# Patient Record
Sex: Female | Born: 1985 | Race: White | Hispanic: No | Marital: Single | Smoking: Former smoker
Health system: Southern US, Community
[De-identification: ages and names within clinical notes are randomized; demographics above are authoritative.]

## PROBLEM LIST (undated history)

## (undated) DIAGNOSIS — O24419 Gestational diabetes mellitus in pregnancy, unspecified control: Secondary | ICD-10-CM

## (undated) DIAGNOSIS — O139 Gestational [pregnancy-induced] hypertension without significant proteinuria, unspecified trimester: Secondary | ICD-10-CM

## (undated) DIAGNOSIS — B999 Unspecified infectious disease: Secondary | ICD-10-CM

## (undated) DIAGNOSIS — E119 Type 2 diabetes mellitus without complications: Secondary | ICD-10-CM

## (undated) DIAGNOSIS — N200 Calculus of kidney: Secondary | ICD-10-CM

## (undated) HISTORY — PX: TUBAL LIGATION: SHX77

---

## 2012-05-18 HISTORY — PX: WISDOM TOOTH EXTRACTION: SHX21

## 2014-01-19 ENCOUNTER — Encounter (HOSPITAL_COMMUNITY): Payer: Self-pay | Admitting: Emergency Medicine

## 2014-01-19 ENCOUNTER — Emergency Department (HOSPITAL_COMMUNITY)
Admission: EM | Admit: 2014-01-19 | Discharge: 2014-01-19 | Disposition: A | Discharge status: Home / Self Care | Payer: Self-pay | Attending: Emergency Medicine | Admitting: Emergency Medicine

## 2014-01-19 DIAGNOSIS — M542 Cervicalgia: Secondary | ICD-10-CM | POA: Insufficient documentation

## 2014-01-19 DIAGNOSIS — Z87891 Personal history of nicotine dependence: Secondary | ICD-10-CM | POA: Insufficient documentation

## 2014-01-19 DIAGNOSIS — R059 Cough, unspecified: Secondary | ICD-10-CM | POA: Insufficient documentation

## 2014-01-19 DIAGNOSIS — M25519 Pain in unspecified shoulder: Secondary | ICD-10-CM | POA: Insufficient documentation

## 2014-01-19 DIAGNOSIS — R05 Cough: Secondary | ICD-10-CM

## 2014-01-19 MED ORDER — AZITHROMYCIN 250 MG PO TABS: 250.0000 mg | ORAL_TABLET | Freq: Every day | ORAL | Status: DC

## 2014-01-19 NOTE — ED Notes (Signed)
Pt sts was diagnosed with bronchitis a week ago, now reports generalized muscle pain, also reports cough, sts medications she was given does not help

## 2014-01-19 NOTE — ED Provider Notes (Signed)
CSN: [id number]     Arrival date & time 01/19/14  1914 History   First MD Initiated Contact with Patient 01/19/14 1008     Chief Complaint  Patient presents with  . Shoulder Pain  . Neck Pain  . Cough     (Consider location/radiation/quality/duration/timing/severity/associated sxs/prior Treatment) Patient is a 28 y.o. female presenting with cough. The history is provided by the patient. No language interpreter was used.  Cough Cough characteristics:  Non-productive Severity:  Moderate Onset quality:  Gradual Duration:  2 weeks Timing:  Constant Progression:  Worsening Chronicity:  New Smoker: no   Context: upper respiratory infection   Context: not sick contacts, not smoke exposure and not weather changes   Relieved by:  Cough suppressants and beta-agonist inhaler Worsened by:  Nothing tried Associated symptoms: rhinorrhea, sinus congestion and sore throat   Associated symptoms: no chest pain, no chills, no fever and no shortness of breath     History reviewed. No pertinent past medical history. Past Surgical History  Procedure Laterality Date  . Cesarean section    . Tubal ligation     No family history on file. History  Substance Use Topics  . Smoking status: Former Smoker    Types: Cigarettes    Quit date: 05/18/2004  . Smokeless tobacco: Not on file  . Alcohol Use: No   OB History   Grav Para Term Preterm Abortions TAB SAB Ect Mult Living                 Review of Systems  Constitutional: Negative for fever and chills.  HENT: Positive for rhinorrhea and sore throat.   Respiratory: Positive for cough. Negative for shortness of breath.   Cardiovascular: Negative for chest pain.  Gastrointestinal: Negative for nausea, vomiting, diarrhea and constipation.  Genitourinary: Negative for dysuria.      Allergies  Review of patient's allergies indicates no known allergies.  Home Medications   Prior to Admission medications   Medication Sig Start Date End  Date Taking? Authorizing Provider  azithromycin (ZITHROMAX Z-PAK) 250 MG tablet Take 1 tablet (250 mg total) by mouth daily.  PO day 1, then  PO days 205 01/19/14   Roxy Horseman, PA-C   BP 113/74  Pulse 77  Temp(Src) 98.1 F (36.7 C) (Oral)  Resp 20  SpO2 98%  LMP 01/14/2014 Physical Exam  Nursing note and vitals reviewed. Constitutional: She is oriented to person, place, and time. She appears well-developed and well-nourished.  HENT:  Head: Normocephalic and atraumatic.  No abscess, mild erythema  Eyes: Conjunctivae and EOM are normal. Pupils are equal, round, and reactive to light.  Neck: Normal range of motion. Neck supple.  Cardiovascular: Normal rate and regular rhythm.  Exam reveals no gallop and no friction rub.   No murmur heard. Pulmonary/Chest: Effort normal and breath sounds normal. No respiratory distress. She has no wheezes. She has no rales. She exhibits no tenderness.  CTAB  Abdominal: Soft. Bowel sounds are normal. She exhibits no distension and no mass. There is no tenderness. There is no rebound and no guarding.  Musculoskeletal: Normal range of motion. She exhibits no edema and no tenderness.  Neurological: She is alert and oriented to person, place, and time.  Skin: Skin is warm and dry.  Psychiatric: She has a normal mood and affect. Her behavior is normal. Judgment and thought content normal.    ED Course  Procedures (including critical care time) Labs Review Labs Reviewed - No data to display  Imaging Review No results found.   EKG Interpretation None      MDM   Final diagnoses:  Cough   Patient with URI.  Sick x2 weeks with double sickening, will start z-pak.  Recommend PCP follow-up.  Roxy Horseman, PA-C 01/19/14 1027

## 2014-01-19 NOTE — Discharge Instructions (Signed)
Cough, Adult ° A cough is a reflex that helps clear your throat and airways. It can help heal the body or may be a reaction to an irritated airway. A cough may only last 2 or 3 weeks (acute) or may last more than 8 weeks (chronic).  °CAUSES °Acute cough: °· Viral or bacterial infections. °Chronic cough: °· Infections. °· Allergies. °· Asthma. °· Post-nasal drip. °· Smoking. °· Heartburn or acid reflux. °· Some medicines. °· Chronic lung problems (COPD). °· Cancer. °SYMPTOMS  °· Cough. °· Fever. °· Chest pain. °· Increased breathing rate. °· High-pitched whistling sound when breathing (wheezing). °· Colored mucus that you cough up (sputum). °TREATMENT  °· A bacterial cough may be treated with antibiotic medicine. °· A viral cough must run its course and will not respond to antibiotics. °· Your caregiver may recommend other treatments if you have a chronic cough. °HOME CARE INSTRUCTIONS  °· Only take over-the-counter or prescription medicines for pain, discomfort, or fever as directed by your caregiver. Use cough suppressants only as directed by your caregiver. °· Use a cold steam vaporizer or humidifier in your bedroom or home to help loosen secretions. °· Sleep in a semi-upright position if your cough is worse at night. °· Rest as needed. °· Stop smoking if you smoke. °SEEK IMMEDIATE MEDICAL CARE IF:  °· You have pus in your sputum. °· Your cough starts to worsen. °· You cannot control your cough with suppressants and are losing sleep. °· You begin coughing up blood. °· You have difficulty breathing. °· You develop pain which is getting worse or is uncontrolled with medicine. °· You have a fever. °MAKE SURE YOU:  °· Understand these instructions. °· Will watch your condition. °· Will get help right away if you are not doing well or get worse. °Document Released: 10/31/2010 Document Revised: 07/27/2011 Document Reviewed: 10/31/2010 °ExitCare® Patient Information ©2015 [employer]. This information is not intended  to replace advice given to you by your health care provider. Make sure you discuss any questions you have with your health care provider. ° °Upper Respiratory Infection, Adult °An upper respiratory infection (URI) is also known as the common cold. It is often caused by a type of germ (virus). Colds are easily spread (contagious). You can pass it to others by kissing, coughing, sneezing, or drinking out of the same glass. Usually, you get better in 1 or 2 weeks.  °HOME CARE  °· Only take medicine as told by your doctor. °· Use a warm mist humidifier or breathe in steam from a hot shower. °· Drink enough water and fluids to keep your pee (urine) clear or pale yellow. °· Get plenty of rest. °· Return to work when your temperature is back to normal or as told by your doctor. You may use a face mask and wash your hands to stop your cold from spreading. °GET HELP RIGHT AWAY IF:  °· After the first few days, you feel you are getting worse. °· You have questions about your medicine. °· You have chills, shortness of breath, or brown or red spit (mucus). °· You have yellow or brown snot (nasal discharge) or pain in the face, especially when you bend forward. °· You have a fever, puffy (swollen) neck, pain when you swallow, or white spots in the back of your throat. °· You have a bad headache, ear pain, sinus pain, or chest pain. °· You have a high-pitched whistling sound when you breathe in and out (wheezing). °· You   have a lasting cough or cough up blood. °· You have sore muscles or a stiff neck. °MAKE SURE YOU:  °· Understand these instructions. °· Will watch your condition. °· Will get help right away if you are not doing well or get worse. °Document Released: 10/21/2007 Document Revised: 07/27/2011 Document Reviewed: 08/09/2013 °ExitCare® Patient Information ©2015 [employer]. This information is not intended to replace advice given to you by your health care provider. Make sure you discuss any questions you have with  your health care provider. ° °

## 2014-01-19 NOTE — ED Provider Notes (Signed)
Medical screening examination/treatment/procedure(s) were performed by non-physician practitioner and as supervising physician I was immediately available for consultation/collaboration.    Linwood Dibbles, MD 01/19/14 [phone]

## 2014-05-18 HISTORY — PX: CHOLECYSTECTOMY: SHX55

## 2016-03-05 ENCOUNTER — Emergency Department
Admission: EM | Admit: 2016-03-05 | Discharge: 2016-03-06 | Disposition: A | Discharge status: Home / Self Care | Payer: Self-pay | Attending: Emergency Medicine | Admitting: Emergency Medicine

## 2016-03-05 ENCOUNTER — Encounter: Payer: Self-pay | Admitting: *Deleted

## 2016-03-05 DIAGNOSIS — W109XXA Fall (on) (from) unspecified stairs and steps, initial encounter: Secondary | ICD-10-CM | POA: Insufficient documentation

## 2016-03-05 DIAGNOSIS — Y939 Activity, unspecified: Secondary | ICD-10-CM | POA: Insufficient documentation

## 2016-03-05 DIAGNOSIS — Y999 Unspecified external cause status: Secondary | ICD-10-CM | POA: Insufficient documentation

## 2016-03-05 DIAGNOSIS — S300XXA Contusion of lower back and pelvis, initial encounter: Secondary | ICD-10-CM | POA: Insufficient documentation

## 2016-03-05 DIAGNOSIS — Y929 Unspecified place or not applicable: Secondary | ICD-10-CM | POA: Insufficient documentation

## 2016-03-05 DIAGNOSIS — Z79899 Other long term (current) drug therapy: Secondary | ICD-10-CM | POA: Insufficient documentation

## 2016-03-05 DIAGNOSIS — Z87891 Personal history of nicotine dependence: Secondary | ICD-10-CM | POA: Insufficient documentation

## 2016-03-05 NOTE — ED Triage Notes (Signed)
Pt reports having fallen four days ago down the stairs and sliding down the stairs. Pt went to PCP after fall and was given flexeril and mobic. Pt reports she had not been feeling any relief so se returned to PCP today and was given a Toradol injection that decreased but did not relieve the pain.  Pt continues to have upper back and neck pain and reported numbness in both arms. No impairments to movement noted. Pt has not had xrays or any scans.

## 2016-03-05 NOTE — ED Notes (Signed)
Pt reports using "foam roller" to try to stretch out back, but that she feels that this increased the pain.  Pt has been seen by Dr. Purnell ShoemakerKaram on Monday and was given muscle relaxers for pain and then again this morning for unrelieved pain.  Pt states she was given a toradol shot at that time.  Pt states the toradol shot gave her some relief, but then after using the "foam roller" her pain increased.  Pt also reports feeling like her hands are asleep and that she has a headache that is unrelieved.

## 2016-03-06 ENCOUNTER — Emergency Department: Payer: Self-pay

## 2016-03-06 MED ORDER — OXYCODONE-ACETAMINOPHEN 5-325 MG PO TABS: 1.0000 | ORAL_TABLET | ORAL | 0 refills | Status: DC | PRN

## 2016-03-06 MED ORDER — OXYCODONE-ACETAMINOPHEN 5-325 MG PO TABS
1.0000 | ORAL_TABLET | Freq: Once | ORAL | Status: AC
  Administered 2016-03-06: 1 via ORAL
  Filled 2016-03-06: qty 1

## 2016-03-06 NOTE — ED Notes (Signed)
POC preg negative.

## 2016-03-06 NOTE — ED Provider Notes (Signed)
[facility] Emergency Department Provider Note   First MD Initiated Contact with Patient 03/06/16 0030     (approximate)  I have reviewed the triage vital signs and the nursing notes.   HISTORY  Chief Complaint Fall    HPI Natasha Abbott is a 30 y.o. female presents with history of "sliding down multiple steps" 4 days ago with resultant mid back and posterior neck pain. Patient denies any loss of consciousness during the fall. Patient states that she was seen by her primary care provider on Monday and prescribed a muscle relaxer and mobile take however symptoms have progressively worsened and a such she returns her primary care provider yesterday and received a Toradol injection. Patient states Toradol helped some however after going home pain worsened and she utilized a foam roller with worsening of symptoms.   Past medical history None There are no active problems to display for this patient.   Past Surgical History:  Procedure Laterality Date  . CESAREAN SECTION    . CHOLECYSTECTOMY  2016  . TUBAL LIGATION    . WISDOM TOOTH EXTRACTION  2014    Prior to Admission medications   Medication Sig Start Date End Date Taking? Authorizing Provider  azithromycin (ZITHROMAX Z-PAK) 250 MG tablet Take 1 tablet (250 mg total) by mouth daily. 500mg  PO day 1, then 250mg  PO days 205 01/19/14   Roxy Horsemanobert Browning, PA-C  oxyCODONE-acetaminophen (ROXICET) 5-325 MG tablet Take 1 tablet by mouth every 4 (four) hours as needed for severe pain. 03/06/16   Darci Currentandolph N Brown, MD    Allergies No known drug allergies  History reviewed. No pertinent family history.  Social History Social History  Substance Use Topics  . Smoking status: Former Smoker    Types: Cigarettes    Quit date: 05/18/2004  . Smokeless tobacco: Never Used  . Alcohol use No    Review of Systems Constitutional: No fever/chills Eyes: No visual changes. ENT: No sore throat. Cardiovascular: Denies  chest pain. Respiratory: Denies shortness of breath. Gastrointestinal: No abdominal pain.  No nausea, no vomiting.  No diarrhea.  No constipation. Genitourinary: Negative for dysuria. Musculoskeletal: Positive for back pain. Skin: Negative for rash. Neurological: Negative for headaches, focal weakness or numbness.  10-point ROS otherwise negative.  ____________________________________________   PHYSICAL EXAM:  VITAL SIGNS: ED Triage Vitals [03/05/16 2307]  Enc Vitals Group     BP 133/83     Pulse Rate 70     Resp 16     Temp 98.4 F (36.9 C)     Temp Source Oral     SpO2 97 %     Weight 235 lb (106.6 kg)     Height 5\' 7"  (1.702 m)     Head Circumference      Peak Flow      Pain Score 7     Pain Loc      Pain Edu?      Excl. in GC?     Constitutional: Alert and oriented. Well appearing and in no acute distress. Eyes: Conjunctivae are normal. PERRL. EOMI. Head: Atraumatic. Mouth/Throat: Mucous membranes are moist.  Oropharynx non-erythematous. Neck: No stridor.  No meningeal signs.   Cardiovascular: Normal rate, regular rhythm. Good peripheral circulation. Grossly normal heart sounds. Respiratory: Normal respiratory effort.  No retractions. Lungs CTAB. Gastrointestinal: Soft and nontender. No distention.  Musculoskeletal: No lower extremity tenderness nor edema. No gross deformities of extremities.Mid back pain with palpation Neurologic:  Normal speech and language. No  gross focal neurologic deficits are appreciated.  Skin:  Skin is warm, dry and intact. No rash noted.     RADIOLOGY I, Crook N BROWN, personally viewed and evaluated these images (plain radiographs) as part of my medical decision making, as well as reviewing the written report by the radiologist.  Dg Cervical Spine Complete  Result Date: 03/06/2016 CLINICAL DATA:  Status post fall down stairs, with neck pain. Initial encounter. EXAM: CERVICAL SPINE - COMPLETE 4+ VIEW COMPARISON:  None. FINDINGS:  There is no evidence of fracture or subluxation. There is slight reversal the normal lordotic curvature of the cervical spine, likely positional in nature. Vertebral bodies demonstrate normal height and alignment. Intervertebral disc spaces are preserved. Prevertebral soft tissues are within normal limits. The provided odontoid view demonstrates no significant abnormality. The visualized lung apices are clear. IMPRESSION: No evidence of fracture or subluxation along the cervical spine. Electronically Signed   By: Roanna Raider M.D.   On: 03/06/2016 01:40   Dg Thoracic Spine 2 View  Result Date: 03/06/2016 CLINICAL DATA:  30 year old female with fall and back pain. EXAM: THORACIC SPINE 2 VIEWS COMPARISON:  None. FINDINGS: No definite acute fracture or subluxation of the thoracic spine. Apparent linear lucency along the posterior aspect of a mid thoracic vertebral seen on the lateral projectiona is most likely artifactual and less likely represents a fracture. Correlation with point tenderness recommended. The vertebral body heights and disc spaces are maintained. The soft tissues are grossly unremarkable. Right upper quadrant cholecystectomy clips. IMPRESSION: No definite acute/ traumatic thoracic spine pathology. Apparent linear lucency along the posterior aspect of the superior endplate of a mid thoracic vertebra seen on the lateral projection likely artifactual. Correlation with point tenderness recommended. CT may provide better evaluation if there is high clinical concern for acute fracture. Electronically Signed   By: Elgie Collard M.D.   On: 03/06/2016 01:42   Ct Thoracic Spine Wo Contrast  Result Date: 03/06/2016 CLINICAL DATA:  Increased thoracic back pain EXAM: CT THORACIC SPINE WITHOUT CONTRAST TECHNIQUE: Multidetector CT imaging of the thoracic spine was performed without intravenous contrast administration. Multiplanar CT image reconstructions were also generated. COMPARISON:  Same date  thoracic spine radiographs FINDINGS: Alignment: Normal Vertebrae: Normal without acute fracture. Small Schmorl's nodes involving the inferior endplate of T11 and superior endplate of T12. Linear lucency suggested at the T9 level the radiographs is believed to be artifactual. No fracture is seen on CT. Paraspinal and other soft tissues: Negative. Disc levels: No apparent herniation. IMPRESSION: No acute osseous abnormality of the thoracic spine. Electronically Signed   By: Tollie Eth M.D.   On: 03/06/2016 02:56    ____________  Procedures    INITIAL IMPRESSION / ASSESSMENT AND PLAN / ED COURSE  Pertinent labs & imaging results that were available during my care of the patient were reviewed by me and considered in my medical decision making (see chart for details).     Clinical Course    ____________________________________________  FINAL CLINICAL IMPRESSION(S) / ED DIAGNOSES  Final diagnoses:  Contusion of lower back, initial encounter     MEDICATIONS GIVEN DURING THIS VISIT:  Medications - No data to display   NEW OUTPATIENT MEDICATIONS STARTED DURING THIS VISIT:  New Prescriptions   OXYCODONE-ACETAMINOPHEN (ROXICET) 5-325 MG TABLET    Take 1 tablet by mouth every 4 (four) hours as needed for severe pain.    Modified Medications   No medications on file    Discontinued Medications   No medications  on file     Note:  This document was prepared using Dragon voice recognition software and may include unintentional dictation errors.    Darci Current, MD 03/06/16 [phone]

## 2016-03-06 NOTE — ED Notes (Signed)
Pt discharged to home.  Family member driving.  Discharge instructions reviewed.  Verbalized understanding.  No questions or concerns at this time.  Teach back verified.  Pt in NAD.  No items left in ED.   

## 2016-10-06 ENCOUNTER — Inpatient Hospital Stay (HOSPITAL_COMMUNITY)
Admission: AD | Admit: 2016-10-06 | Discharge: 2016-10-06 | Disposition: A | Discharge status: Home / Self Care | Payer: Self-pay | Source: Ambulatory Visit | Attending: Family Medicine | Admitting: Family Medicine

## 2016-10-06 DIAGNOSIS — R102 Pelvic and perineal pain: Secondary | ICD-10-CM | POA: Insufficient documentation

## 2016-10-06 DIAGNOSIS — Z87891 Personal history of nicotine dependence: Secondary | ICD-10-CM | POA: Insufficient documentation

## 2016-10-06 DIAGNOSIS — B9689 Other specified bacterial agents as the cause of diseases classified elsewhere: Secondary | ICD-10-CM | POA: Insufficient documentation

## 2016-10-06 DIAGNOSIS — Z888 Allergy status to other drugs, medicaments and biological substances status: Secondary | ICD-10-CM | POA: Insufficient documentation

## 2016-10-06 DIAGNOSIS — N760 Acute vaginitis: Secondary | ICD-10-CM | POA: Insufficient documentation

## 2016-10-06 LAB — CBC
HCT: 39.1 % (ref 36.0–46.0)
HEMOGLOBIN: 12.4 g/dL (ref 12.0–15.0)
MCH: 26.2 pg (ref 26.0–34.0)
MCHC: 31.7 g/dL (ref 30.0–36.0)
MCV: 82.5 fL (ref 78.0–100.0)
Platelets: 375 10*3/uL (ref 150–400)
RBC: 4.74 MIL/uL (ref 3.87–5.11)
RDW: 15 % (ref 11.5–15.5)
WBC: 9 10*3/uL (ref 4.0–10.5)

## 2016-10-06 LAB — WET PREP, GENITAL
SPERM: NONE SEEN
TRICH WET PREP: NONE SEEN
YEAST WET PREP: NONE SEEN

## 2016-10-06 LAB — URINALYSIS, ROUTINE W REFLEX MICROSCOPIC
BACTERIA UA: NONE SEEN
Bilirubin Urine: NEGATIVE
Glucose, UA: NEGATIVE mg/dL
Ketones, ur: 5 mg/dL — AB
Leukocytes, UA: NEGATIVE
NITRITE: NEGATIVE
Protein, ur: NEGATIVE mg/dL
SPECIFIC GRAVITY, URINE: 1.021 (ref 1.005–1.030)
pH: 5 (ref 5.0–8.0)

## 2016-10-06 LAB — POCT PREGNANCY, URINE: PREG TEST UR: NEGATIVE

## 2016-10-06 MED ORDER — METRONIDAZOLE 500 MG PO TABS: 500.0000 mg | ORAL_TABLET | Freq: Two times a day (BID) | ORAL | 0 refills | Status: DC

## 2016-10-06 MED ORDER — KETOROLAC TROMETHAMINE 30 MG/ML IJ SOLN
30.0000 mg | Freq: Once | INTRAMUSCULAR | Status: AC
  Administered 2016-10-06: 30 mg via INTRAMUSCULAR
  Filled 2016-10-06: qty 1

## 2016-10-06 NOTE — Discharge Instructions (Signed)
Bacterial Vaginosis Bacterial vaginosis is a vaginal infection that occurs when the normal balance of bacteria in the vagina is disrupted. It results from an overgrowth of certain bacteria. This is the most common vaginal infection among women ages 15-44. Because bacterial vaginosis increases your risk for STIs (sexually transmitted infections), getting treated can help reduce your risk for chlamydia, gonorrhea, herpes, and HIV (human immunodeficiency virus). Treatment is also important for preventing complications in pregnant women, because this condition can cause an early (premature) delivery. What are the causes? This condition is caused by an increase in harmful bacteria that are normally present in small amounts in the vagina. However, the reason that the condition develops is not fully understood. What increases the risk? The following factors may make you more likely to develop this condition:  Having a new sexual partner or multiple sexual partners.  Having unprotected sex.  Douching.  Having an intrauterine device (IUD).  Smoking.  Drug and alcohol abuse.  Taking certain antibiotic medicines.  Being pregnant.  You cannot get bacterial vaginosis from toilet seats, bedding, swimming pools, or contact with objects around you. What are the signs or symptoms? Symptoms of this condition include:  Grey or white vaginal discharge. The discharge can also be watery or foamy.  A fish-like odor with discharge, especially after sexual intercourse or during menstruation.  Itching in and around the vagina.  Burning or pain with urination.  Some women with bacterial vaginosis have no signs or symptoms. How is this diagnosed? This condition is diagnosed based on:  Your medical history.  A physical exam of the vagina.  Testing a sample of vaginal fluid under a microscope to look for a large amount of bad bacteria or abnormal cells. Your health care provider may use a cotton swab  or a small wooden spatula to collect the sample.  How is this treated? This condition is treated with antibiotics. These may be given as a pill, a vaginal cream, or a medicine that is put into the vagina (suppository). If the condition comes back after treatment, a second round of antibiotics may be needed. Follow these instructions at home: Medicines  Take over-the-counter and prescription medicines only as told by your health care provider.  Take or use your antibiotic as told by your health care provider. Do not stop taking or using the antibiotic even if you start to feel better. General instructions  If you have a female sexual partner, tell her that you have a vaginal infection. She should see her health care provider and be treated if she has symptoms. If you have a female sexual partner, he does not need treatment.  During treatment: ? Avoid sexual activity until you finish treatment. ? Do not douche. ? Avoid alcohol as directed by your health care provider. ? Avoid breastfeeding as directed by your health care provider.  Drink enough water and fluids to keep your urine clear or pale yellow.  Keep the area around your vagina and rectum clean. ? Wash the area daily with warm water. ? Wipe yourself from front to back after using the toilet.  Keep all follow-up visits as told by your health care provider. This is important. How is this prevented?  Do not douche.  Wash the outside of your vagina with warm water only.  Use protection when having sex. This includes latex condoms and dental dams.  Limit how many sexual partners you have. To help prevent bacterial vaginosis, it is best to have sex with just   one partner (monogamous).  Make sure you and your sexual partner are tested for STIs.  Wear cotton or cotton-lined underwear.  Avoid wearing tight pants and pantyhose, especially during summer.  Limit the amount of alcohol that you drink.  Do not use any products that  contain nicotine or tobacco, such as cigarettes and e-cigarettes. If you need help quitting, ask your health care provider.  Do not use illegal drugs. Where to find more information:  Centers for Disease Control and Prevention: www.cdc.gov/std  American Sexual Health Association (ASHA): www.ashastd.org  [place] Department of Health and Human Services, Office on Women's Health: www.womenshealth.gov/ or https://www.womenshealth.gov/a-z-topics/bacterial-vaginosis Contact a health care provider if:  Your symptoms do not improve, even after treatment.  You have more discharge or pain when urinating.  You have a fever.  You have pain in your abdomen.  You have pain during sex.  You have vaginal bleeding between periods. Summary  Bacterial vaginosis is a vaginal infection that occurs when the normal balance of bacteria in the vagina is disrupted.  Because bacterial vaginosis increases your risk for STIs (sexually transmitted infections), getting treated can help reduce your risk for chlamydia, gonorrhea, herpes, and HIV (human immunodeficiency virus). Treatment is also important for preventing complications in pregnant women, because the condition can cause an early (premature) delivery.  This condition is treated with antibiotic medicines. These may be given as a pill, a vaginal cream, or a medicine that is put into the vagina (suppository). This information is not intended to replace advice given to you by your health care provider. Make sure you discuss any questions you have with your health care provider. Document Released: 05/04/2005 Document Revised: 01/18/2016 Document Reviewed: 01/18/2016 [employer] Interactive Patient Education  2017 [employer]  

## 2016-10-06 NOTE — MAU Provider Note (Signed)
History     CSN: [id number]  Arrival date and time: 10/06/16 1844   First Provider Initiated Contact with Patient 10/06/16 1931     Non-pregnant female here with LAP. Pain started about 2 mos ago but has worsened over the last 2 weeks. She describes as bilateral, constant, achy, and at the level of her CS scar. Rates 5/10. Has tried heat, Ibuprofen, Tylenol, and Aleve and had no relief. Denies fever. No urinary sx. No vaginal discharge but reports intermenstrual spotting, which occasionally occurs after IC. No new partner in 5 years. No hx of STIs. LMP 09/27/16.    No past medical history on file.  Past Surgical History:  Procedure Laterality Date  . CESAREAN SECTION    . CHOLECYSTECTOMY  2016  . TUBAL LIGATION    . WISDOM TOOTH EXTRACTION  2014    No family history on file.  Social History  Substance Use Topics  . Smoking status: Former Smoker    Types: Cigarettes    Quit date: 05/18/2004  . Smokeless tobacco: Never Used  . Alcohol use No    Allergies:  Allergies  Allergen Reactions  . 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 ALPRAZolam (XANAX) 1 MG tablet Take 0.5-1 mg by mouth daily as needed for anxiety (panic attack).   Past Week at Unknown time  . naproxen sodium (ANAPROX) 220 MG tablet Take 220 mg by mouth 2 (two) times daily as needed (pain).   10/05/2016 at Unknown time  . Naproxen Sodium (PAMPRIN ALL DAY RELIEF MAX ST PO) Take 2 tablets by mouth daily as needed (cramps).   10/06/2016 at Unknown time  . Pediatric Multiple Vit-C-FA (FLINSTONES GUMMIES OMEGA-3 DHA) CHEW Chew 1 tablet by mouth daily.   Past Month at Unknown time  . oxyCODONE-acetaminophen (ROXICET) 5-325 MG tablet Take 1 tablet by mouth every 4 (four) hours as needed for severe pain. (Patient not taking: Reported on 10/06/2016)  12 tablet 0 Not Taking at Unknown time    Review of Systems  Constitutional: Positive for activity change.  Gastrointestinal: Positive for abdominal pain and nausea. Negative for constipation, diarrhea and vomiting.  Genitourinary: Positive for dyspareunia and vaginal bleeding. Negative for dysuria, frequency, urgency and vaginal discharge.   Physical Exam   Blood pressure 121/68, pulse 74, temperature 97.7 F (36.5 C), temperature source Oral, resp. rate 18, height 5\' 7"  (1.702 m), weight 113.4 kg (250 lb).  Physical Exam  Constitutional: She is oriented to person, place, and time. She appears well-developed and well-nourished. No distress (appears comfortable).  HENT:  Head: Normocephalic and atraumatic.  Neck: Normal range of motion.  Cardiovascular: Normal rate.   Respiratory: Effort normal.  GI: Soft. She exhibits no distension and no mass. There is no tenderness. There is no rebound and no guarding.  Genitourinary:  Genitourinary Comments: External: no lesions or erythema Vagina: rugated, parous, thin watery brown discharge with malodor Uterus: non enlarged, anteverted, non tender, no CMT Adnexae: no masses, + tenderness left, + tenderness right   Musculoskeletal: Normal range of motion.  Neurological: She is alert and oriented to person, place, and time.  Skin: Skin is warm and dry.  Psychiatric: She has a normal mood and affect.   Results for orders placed or performed during the hospital encounter of 10/06/16 (from the past 24 hour(s))  Urinalysis, Routine w  reflex microscopic     Status: Abnormal   Collection Time: 10/06/16  7:07 PM  Result Value Ref Range   Color, Urine YELLOW YELLOW   APPearance CLEAR CLEAR   Specific Gravity, Urine 1.021 1.005 - 1.030   pH 5.0 5.0 - 8.0   Glucose, UA NEGATIVE NEGATIVE mg/dL   Hgb urine dipstick MODERATE (A) NEGATIVE   Bilirubin Urine NEGATIVE NEGATIVE   Ketones, ur 5 (A) NEGATIVE mg/dL   Protein, ur NEGATIVE NEGATIVE mg/dL    Nitrite NEGATIVE NEGATIVE   Leukocytes, UA NEGATIVE NEGATIVE   RBC / HPF 0-5 0 - 5 RBC/hpf   WBC, UA 0-5 0 - 5 WBC/hpf   Bacteria, UA NONE SEEN NONE SEEN   Squamous Epithelial / LPF 0-5 (A) NONE SEEN   Mucous PRESENT   Pregnancy, urine POC     Status: None   Collection Time: 10/06/16  7:19 PM  Result Value Ref Range   Preg Test, Ur NEGATIVE NEGATIVE  Wet prep, genital     Status: Abnormal   Collection Time: 10/06/16  7:45 PM  Result Value Ref Range   Yeast Wet Prep HPF POC NONE SEEN NONE SEEN   Trich, Wet Prep NONE SEEN NONE SEEN   Clue Cells Wet Prep HPF POC PRESENT (A) NONE SEEN   WBC, Wet Prep HPF POC FEW (A) NONE SEEN   Sperm NONE SEEN   CBC     Status: None   Collection Time: 10/06/16  8:04 PM  Result Value Ref Range   WBC 9.0 4.0 - 10.5 K/uL   RBC 4.74 3.87 - 5.11 MIL/uL   Hemoglobin 12.4 12.0 - 15.0 g/dL   HCT 16.1 09.6 - 04.5 %   MCV 82.5 78.0 - 100.0 fL   MCH 26.2 26.0 - 34.0 pg   MCHC 31.7 30.0 - 36.0 g/dL   RDW 40.9 81.1 - 91.4 %   Platelets 375 150 - 400 K/uL   MAU Course  Procedures Toradol 30 mg IM  MDM Labs ordered and reviewed. Some improvement of pain initially after medication. No evidence of acute abdominal or pelvic process. Pain could be caused by BV. If pain persists after course of Flagyl then I recommend f/u with GYN as she may need further workup for her pelvic pain. She is stable for discharge home.   Assessment and Plan   1. Pelvic pain in female   2. 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   FLINSTONES GUMMIES OMEGA-3 DHA Chew Chew 1 tablet by mouth daily.   metroNIDAZOLE 500 MG tablet Commonly known as:  FLAGYL Take 1 tablet (500 mg total) by mouth 2 (two) times daily.   naproxen sodium 220 MG tablet Commonly known as:  ANAPROX Take 220 mg by mouth 2 (two) times daily as needed (pain). What changed:  Another medication with the same name was  removed. Continue taking this medication, and follow the directions you see here.      Donette LarryMelanie Brittanie Dosanjh, CNM 10/06/2016, 7:52 PM

## 2016-10-06 NOTE — MAU Note (Signed)
Pt. Here with complaint of pain in lower abd, aching. Started about two weeks ago, pain getting stronger.  Also a complaint of vaginal spotting.

## 2016-10-07 LAB — GC/CHLAMYDIA PROBE AMP (~~LOC~~) NOT AT ARMC
CHLAMYDIA, DNA PROBE: NEGATIVE
NEISSERIA GONORRHEA: NEGATIVE

## 2016-11-06 ENCOUNTER — Encounter: Payer: Self-pay | Admitting: Obstetrics & Gynecology

## 2016-12-01 ENCOUNTER — Encounter: Payer: Self-pay | Admitting: Obstetrics & Gynecology

## 2017-07-17 DIAGNOSIS — E119 Type 2 diabetes mellitus without complications: Secondary | ICD-10-CM

## 2017-07-17 HISTORY — DX: Type 2 diabetes mellitus without complications: E11.9

## 2017-07-22 ENCOUNTER — Inpatient Hospital Stay (HOSPITAL_COMMUNITY)
Admission: AD | Admit: 2017-07-22 | Discharge: 2017-07-22 | Disposition: A | Discharge status: Home / Self Care | Payer: Self-pay | Source: Ambulatory Visit | Attending: Obstetrics and Gynecology | Admitting: Obstetrics and Gynecology

## 2017-07-22 ENCOUNTER — Other Ambulatory Visit: Payer: Self-pay

## 2017-07-22 ENCOUNTER — Encounter (HOSPITAL_COMMUNITY): Payer: Self-pay | Admitting: *Deleted

## 2017-07-22 DIAGNOSIS — N939 Abnormal uterine and vaginal bleeding, unspecified: Secondary | ICD-10-CM | POA: Insufficient documentation

## 2017-07-22 DIAGNOSIS — Z7984 Long term (current) use of oral hypoglycemic drugs: Secondary | ICD-10-CM | POA: Insufficient documentation

## 2017-07-22 DIAGNOSIS — Z87891 Personal history of nicotine dependence: Secondary | ICD-10-CM | POA: Insufficient documentation

## 2017-07-22 DIAGNOSIS — Z79899 Other long term (current) drug therapy: Secondary | ICD-10-CM | POA: Insufficient documentation

## 2017-07-22 DIAGNOSIS — M545 Low back pain: Secondary | ICD-10-CM | POA: Insufficient documentation

## 2017-07-22 DIAGNOSIS — E119 Type 2 diabetes mellitus without complications: Secondary | ICD-10-CM | POA: Insufficient documentation

## 2017-07-22 DIAGNOSIS — M549 Dorsalgia, unspecified: Secondary | ICD-10-CM

## 2017-07-22 DIAGNOSIS — Z87442 Personal history of urinary calculi: Secondary | ICD-10-CM | POA: Insufficient documentation

## 2017-07-22 DIAGNOSIS — R109 Unspecified abdominal pain: Secondary | ICD-10-CM | POA: Insufficient documentation

## 2017-07-22 HISTORY — DX: Calculus of kidney: N20.0

## 2017-07-22 HISTORY — DX: Unspecified infectious disease: B99.9

## 2017-07-22 HISTORY — DX: Gestational diabetes mellitus in pregnancy, unspecified control: O24.419

## 2017-07-22 HISTORY — DX: Gestational (pregnancy-induced) hypertension without significant proteinuria, unspecified trimester: O13.9

## 2017-07-22 HISTORY — DX: Type 2 diabetes mellitus without complications: E11.9

## 2017-07-22 LAB — URINALYSIS, ROUTINE W REFLEX MICROSCOPIC
BACTERIA UA: NONE SEEN
Bilirubin Urine: NEGATIVE
Glucose, UA: NEGATIVE mg/dL
Ketones, ur: NEGATIVE mg/dL
Leukocytes, UA: NEGATIVE
Nitrite: NEGATIVE
PH: 6 (ref 5.0–8.0)
PROTEIN: NEGATIVE mg/dL
Specific Gravity, Urine: 1.017 (ref 1.005–1.030)

## 2017-07-22 LAB — CBC
HCT: 37 % (ref 36.0–46.0)
Hemoglobin: 12.1 g/dL (ref 12.0–15.0)
MCH: 26.7 pg (ref 26.0–34.0)
MCHC: 32.7 g/dL (ref 30.0–36.0)
MCV: 81.7 fL (ref 78.0–100.0)
PLATELETS: 404 10*3/uL — AB (ref 150–400)
RBC: 4.53 MIL/uL (ref 3.87–5.11)
RDW: 14.1 % (ref 11.5–15.5)
WBC: 9 10*3/uL (ref 4.0–10.5)

## 2017-07-22 LAB — WET PREP, GENITAL
Clue Cells Wet Prep HPF POC: NONE SEEN
Sperm: NONE SEEN
TRICH WET PREP: NONE SEEN
Yeast Wet Prep HPF POC: NONE SEEN

## 2017-07-22 LAB — POCT PREGNANCY, URINE: Preg Test, Ur: NEGATIVE

## 2017-07-22 MED ORDER — CYCLOBENZAPRINE HCL 10 MG PO TABS: 10.0000 mg | ORAL_TABLET | Freq: Three times a day (TID) | ORAL | 0 refills | Status: DC | PRN

## 2017-07-22 MED ORDER — KETOROLAC TROMETHAMINE 30 MG/ML IJ SOLN
60.0000 mg | Freq: Once | INTRAMUSCULAR | Status: AC
  Administered 2017-07-22: 60 mg via INTRAMUSCULAR
  Filled 2017-07-22: qty 2

## 2017-07-22 NOTE — MAU Provider Note (Signed)
History     CSN: [id number]  Arrival date and time: 07/22/17 1545   First Provider Initiated Contact with Patient 07/22/17 1619      Chief Complaint  Patient presents with  . Vaginal Bleeding   32 y.o. nonpregnant female here with abnormal VB and LBP. Back pain is low and bilateral, started earlier this week. She originally thought it was from cleaning out a storage closet the weekend before. She has used Tylenol and Ibuprofen, and heat and only warm baths help. She is also concerned because her LMP was last week then she started having cramping and VB today. She has used 1 pad today. No fevers. No urinary sx. No GI sx. She reports heavier, more painful menses over the last 2 years. [place] in [place] but lost her insurance and did not have follow up.   Past Medical History:  Diagnosis Date  . Gestational diabetes    on insulin with 3rd  . Infection    UTI  . Kidney stones   . Pregnancy induced hypertension    mild elevation with first   . Type 2 diabetes mellitus (HCC) 07/17/2017   started on Metformin    Past Surgical History:  Procedure Laterality Date  . CESAREAN SECTION    . CHOLECYSTECTOMY  2016  . TUBAL LIGATION    . WISDOM TOOTH EXTRACTION  2014    Family History  Problem Relation Age of Onset  . Hypothyroidism Mother   . Hypertension Father   . Diabetes Father     Social History   Tobacco Use  . Smoking status: Former Smoker    Types: Cigarettes    Last attempt to quit: 05/18/2004    Years since quitting: 13.1  . Smokeless tobacco: Never Used  Substance Use Topics  . Alcohol use: No  . Drug use: No    Allergies:  Allergies  Allergen Reactions  . 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 ALPRAZolam (XANAX) 1 MG tablet Take 0.5-1 mg by mouth daily as needed  for anxiety (panic attack).   07/21/2017 at Unknown time  . metFORMIN (GLUCOPHAGE) 500 MG tablet Take 500 mg by mouth 2 (two) times daily with a meal.   07/21/2017 at Unknown time    Review of Systems  Constitutional: Negative for fever.  Gastrointestinal: Positive for abdominal pain. Negative for constipation, diarrhea, nausea and vomiting.  Genitourinary: Positive for vaginal bleeding. Negative for dysuria, frequency and urgency.  Musculoskeletal: Positive for back pain.   Physical Exam   Blood pressure 135/73, pulse 75, temperature 98.4 F (36.9 C), temperature source Oral, resp. rate 18, last menstrual period 07/13/2017.  Physical Exam  Nursing note and vitals reviewed. Constitutional: She is oriented to person, place, and time. She appears well-developed and well-nourished. No distress.  HENT:  Head: Normocephalic and atraumatic.  Neck: Normal range of motion.  Cardiovascular: Normal rate.  Respiratory: Effort normal. No respiratory distress.  GI: Soft. She exhibits no distension and no mass. There is tenderness in the right lower quadrant, suprapubic area and left lower quadrant. There is no rebound, no guarding and no CVA tenderness.  Genitourinary:  Genitourinary Comments: External: no lesions or erythema Vagina: rugated, pink, moist, scant drk red bloody discharge, no active bleeding Uterus: non enlarged, anteverted, non tender, no CMT Adnexae: no masses, no tenderness  left, no tenderness right    Musculoskeletal: Normal range of motion.       Cervical back: Normal. She exhibits no tenderness.       Thoracic back: Normal. She exhibits no tenderness.       Lumbar back: She exhibits tenderness. She exhibits no deformity.  Neurological: She is alert and oriented to person, place, and time.  Skin: Skin is warm and dry.  Psychiatric: She has a normal mood and affect.   Results for orders placed or performed during the hospital encounter of 07/22/17 (from the past 24 hour(s))   Urinalysis, Routine w reflex microscopic     Status: Abnormal   Collection Time: 07/22/17  3:59 PM  Result Value Ref Range   Color, Urine YELLOW YELLOW   APPearance CLEAR CLEAR   Specific Gravity, Urine 1.017 1.005 - 1.030   pH 6.0 5.0 - 8.0   Glucose, UA NEGATIVE NEGATIVE mg/dL   Hgb urine dipstick LARGE (A) NEGATIVE   Bilirubin Urine NEGATIVE NEGATIVE   Ketones, ur NEGATIVE NEGATIVE mg/dL   Protein, ur NEGATIVE NEGATIVE mg/dL   Nitrite NEGATIVE NEGATIVE   Leukocytes, UA NEGATIVE NEGATIVE   RBC / HPF 0-5 0 - 5 RBC/hpf   WBC, UA 0-5 0 - 5 WBC/hpf   Bacteria, UA NONE SEEN NONE SEEN   Squamous Epithelial / LPF 0-5 (A) NONE SEEN  Pregnancy, urine POC     Status: None   Collection Time: 07/22/17  4:22 PM  Result Value Ref Range   Preg Test, Ur NEGATIVE NEGATIVE  Wet prep, genital     Status: Abnormal   Collection Time: 07/22/17  4:41 PM  Result Value Ref Range   Yeast Wet Prep HPF POC NONE SEEN NONE SEEN   Trich, Wet Prep NONE SEEN NONE SEEN   Clue Cells Wet Prep HPF POC NONE SEEN NONE SEEN   WBC, Wet Prep HPF POC FEW (A) NONE SEEN   Sperm NONE SEEN   CBC     Status: Abnormal   Collection Time: 07/22/17  5:05 PM  Result Value Ref Range   WBC 9.0 4.0 - 10.5 K/uL   RBC 4.53 3.87 - 5.11 MIL/uL   Hemoglobin 12.1 12.0 - 15.0 g/dL   HCT 16.1 09.6 - 04.5 %   MCV 81.7 78.0 - 100.0 fL   MCH 26.7 26.0 - 34.0 pg   MCHC 32.7 30.0 - 36.0 g/dL   RDW 40.9 81.1 - 91.4 %   Platelets 404 (H) 150 - 400 K/uL   MAU Course  Procedures Toradol  MDM Labs ordered and reviewed. Cramping improved after med, back pain continues. No evidence of acute abdominal or pelvic process. LBP likely MSK. Unclear etiology of VB today. Recommend oupatient f/u with GYN (already scheduled for next month). Stable for discharge home.   Assessment and Plan   1. Musculoskeletal back pain   2. Abnormal uterine bleeding (AUB)   3. 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   cyclobenzaprine 10 MG tablet Commonly known as:  FLEXERIL Take 1 tablet (10 mg total) by mouth every 8 (eight) hours as needed for muscle spasms.   metFORMIN 500 MG tablet Commonly known as:  GLUCOPHAGE Take 500 mg by mouth 2 (two) times daily with a meal.      Donette Larry, CNM 07/22/2017, 6:33 PM

## 2017-07-22 NOTE — MAU Note (Signed)
Pt presents with c/o heavy VB and passing large clots.  States bleeding began today, reports LMP 07/13/17.  Reports s/p BTL.

## 2017-07-22 NOTE — MAU Note (Signed)
Just got off period last wk (ontime, but super heavy), been out of work this week because of back pain, started cramping and bleeding earlier today.

## 2017-07-22 NOTE — Discharge Instructions (Signed)
Abnormal Uterine Bleeding Abnormal uterine bleeding means bleeding more than usual from your uterus. It can include:  Bleeding between periods.  Bleeding after sex.  Bleeding that is heavier than normal.  Periods that last longer than usual.  Bleeding after you have stopped having your period (menopause).  There are many problems that may cause this. You should see a doctor for any kind of bleeding that is not normal. Treatment depends on the cause of the bleeding. Follow these instructions at home:  Watch your condition for any changes.  Do not use tampons, douche, or have sex, if your doctor tells you not to.  Change your pads often.  Get regular well-woman exams. Make sure they include a pelvic exam and cervical cancer screening.  Keep all follow-up visits as told by your doctor. This is important. Contact a doctor if:  The bleeding lasts more than one week.  You feel dizzy at times.  You feel like you are going to throw up (nauseous).  You throw up. Get help right away if:  You pass out.  You have to change pads every hour.  You have belly (abdominal) pain.  You have a fever.  You get sweaty.  You get weak.  You passing large blood clots from your vagina. Summary  Abnormal uterine bleeding means bleeding more than usual from your uterus.  There are many problems that may cause this. You should see a doctor for any kind of bleeding that is not normal.  Treatment depends on the cause of the bleeding. This information is not intended to replace advice given to you by your health care provider. Make sure you discuss any questions you have with your health care provider. Document Released: 03/01/2009 Document Revised: 04/28/2016 Document Reviewed: 04/28/2016 Elsevier Interactive Patient Education  2017 [employer] Back Pain, Adult Back pain is very common. The pain often gets better over time. The cause of back pain is usually not dangerous. Most  people can learn to manage their back pain on their own. Follow these instructions at home: Watch your back pain for any changes. The following actions may help to lessen any pain you are feeling:  Stay active. Start with short walks on flat ground if you can. Try to walk farther each day.  Exercise regularly as told by your doctor. Exercise helps your back heal faster. It also helps avoid future injury by keeping your muscles strong and flexible.  Do not sit, drive, or stand in one place for more than 30 minutes.  Do not stay in bed. Resting more than 1-2 days can slow down your recovery.  Be careful when you bend or lift an object. Use good form when lifting: ? Bend at your knees. ? Keep the object close to your body. ? Do not twist.  Sleep on a firm mattress. Lie on your side, and bend your knees. If you lie on your back, put a pillow under your knees.  Take medicines only as told by your doctor.  Put ice on the injured area. ? Put ice in a plastic bag. ? Place a towel between your skin and the bag. ? Leave the ice on for 20 minutes, 2-3 times a day for the first 2-3 days. After that, you can switch between ice and heat packs.  Avoid feeling anxious or stressed. Find good ways to deal with stress, such as exercise.  Maintain a healthy weight. Extra weight puts stress on your back.  Contact a doctor  if:  You have pain that does not go away with rest or medicine.  You have worsening pain that goes down into your legs or buttocks.  You have pain that does not get better in one week.  You have pain at night.  You lose weight.  You have a fever or chills. Get help right away if:  You cannot control when you poop (bowel movement) or pee (urinate).  Your arms or legs feel weak.  Your arms or legs lose feeling (numbness).  You feel sick to your stomach (nauseous) or throw up (vomit).  You have belly (abdominal) pain.  You feel like you may pass out (faint). This  information is not intended to replace advice given to you by your health care provider. Make sure you discuss any questions you have with your health care provider. Document Released: 10/21/2007 Document Revised: 10/10/2015 Document Reviewed: 09/05/2013 Elsevier Interactive Patient Education  [employer].

## 2017-07-24 LAB — GC/CHLAMYDIA PROBE AMP (~~LOC~~) NOT AT ARMC
Chlamydia: NEGATIVE
Neisseria Gonorrhea: NEGATIVE

## 2017-08-23 ENCOUNTER — Ambulatory Visit: Payer: Self-pay | Admitting: Family Medicine

## 2017-08-23 ENCOUNTER — Encounter: Payer: Self-pay | Admitting: Family Medicine

## 2017-08-23 VITALS — BP 117/72 | HR 83 | Wt 260.5 lb

## 2017-08-23 DIAGNOSIS — N946 Dysmenorrhea, unspecified: Secondary | ICD-10-CM

## 2017-08-23 DIAGNOSIS — N920 Excessive and frequent menstruation with regular cycle: Secondary | ICD-10-CM | POA: Insufficient documentation

## 2017-08-23 DIAGNOSIS — R102 Pelvic and perineal pain: Secondary | ICD-10-CM

## 2017-08-23 DIAGNOSIS — E119 Type 2 diabetes mellitus without complications: Secondary | ICD-10-CM | POA: Insufficient documentation

## 2017-08-23 DIAGNOSIS — K219 Gastro-esophageal reflux disease without esophagitis: Secondary | ICD-10-CM

## 2017-08-23 MED ORDER — TRAMADOL HCL 50 MG PO TABS: 50.0000 mg | ORAL_TABLET | Freq: Four times a day (QID) | ORAL | 0 refills | Status: DC | PRN

## 2017-08-23 NOTE — Progress Notes (Signed)
Free Pap Screening info given  US scheduled for April 12th @ 1100.  Pt notified.

## 2017-08-23 NOTE — Assessment & Plan Note (Signed)
Check u/s--states she had normal blood work with PCP

## 2017-08-23 NOTE — Progress Notes (Signed)
   Subjective:    Patient ID: Natasha Abbott is a 32 y.o. female presenting with Menorrhagia  on 08/23/2017  HPI: Seen previously with Dr. Donavan FoilBass, and options reviewed for heavy bleeding, no f/u due of insurance. Had C-section with BTL 7 years ago. Noted that her cycles were heavier. Last one year, more pain and bleeding and passing clots. Pain is worse on her left side. Pain awakens her from sleep. Tries Ibuprofen and tylenol and heat and hot baths and nothing is really helping. Also tried [employer] ointment. Notes significant abdominal pain when taking NSAIDS. She and her partner are considering BTL reversal if she loses weight.  Review of Systems  Constitutional: Negative for chills and fever.  Respiratory: Negative for shortness of breath.   Cardiovascular: Negative for chest pain.  Gastrointestinal: Negative for abdominal pain, nausea and vomiting.  Genitourinary: Negative for dysuria.  Skin: Negative for rash.      Objective:    BP 117/72   Pulse 83   Wt 260 lb 8 oz (118.2 kg)   LMP 08/16/2017 (Approximate)   BMI 40.80 kg/m  Physical Exam  Constitutional: She is oriented to person, place, and time. She appears well-developed and well-nourished. No distress.  HENT:  Head: Normocephalic and atraumatic.  Eyes: No scleral icterus.  Neck: Neck supple.  Cardiovascular: Normal rate.  Pulmonary/Chest: Effort normal.  Abdominal: Soft.  Neurological: She is alert and oriented to person, place, and time.  Skin: Skin is warm and dry.  Psychiatric: She has a normal mood and affect.      Assessment & Plan:   Problem List Items Addressed This Visit      Unprioritized   Menorrhagia    Check u/s--states she had normal blood work with PCP      Relevant Orders   US PELVIS (TRANSABDOMINAL ONLY)   US PELVIS TRANSVANGINAL NON-OB (TV ONLY)   Diabetes type 2, controlled (HCC)   Morbid obesity (HCC)   GERD (gastroesophageal reflux disease)   Dysmenorrhea    Trial of Tramadol. May use  OTC PPI so that she tolerates NSAIDS. Check pelvic sono, may have adenomyosis due to C-section or other ovarian pathology.--may need laparoscopy if nothing found to r/o endometriosis or treat other finding.       Other Visit Diagnoses    Pelvic pain    -  Primary   Relevant Medications   traMADol (ULTRAM) 50 MG tablet      Total face-to-face time with patient: 20 minutes. Over 50% of encounter was spent on counseling and coordination of care. Return in about 1 month (around 09/20/2017).  Reva Boresanya S Carrina Schoenberger 08/23/2017 9:59 AM

## 2017-08-23 NOTE — Patient Instructions (Signed)
Menorrhagia Menorrhagia is when your menstrual periods are heavy or last longer than usual. Follow these instructions at home:  Only take medicine as told by your doctor.  Take any iron pills as told by your doctor. Heavy bleeding may cause low levels of iron in your body.  Do not take aspirin 1 week before or during your period. Aspirin can make the bleeding worse.  Lie down for a while if you change your tampon or pad more than once in 2 hours. This may help lessen the bleeding.  Eat a healthy diet and foods with iron. These foods include leafy green vegetables, meat, liver, eggs, and whole grain breads and cereals.  Do not try to lose weight. Wait until the heavy bleeding has stopped and your iron level is normal. Contact a doctor if:  You soak through a pad or tampon every 1 or 2 hours, and this happens every time you have a period.  You need to use pads and tampons at the same time because you are bleeding so much.  You need to change your pad or tampon during the night.  You have a period that lasts for more than 8 days.  You pass clots bigger than 1 inch (2.5 cm) wide.  You have irregular periods that happen more or less often than once a month.  You feel dizzy or pass out (faint).  You feel very weak or tired.  You feel short of breath or feel your heart is beating too fast when you exercise.  You feel sick to your stomach (nausea) and you throw up (vomit) while you are taking your medicine.  You have watery poop (diarrhea) while you are taking your medicine.  You have any problems that may be related to the medicine you are taking. Get help right away if:  You soak through 4 or more pads or tampons in 2 hours.  You have any bleeding while you are pregnant. This information is not intended to replace advice given to you by your health care provider. Make sure you discuss any questions you have with your health care provider. Document Released: 02/11/2008 Document  Revised: 10/10/2015 Document Reviewed: 11/03/2012 Elsevier Interactive Patient Education  2017 Elsevier Inc.  

## 2017-08-23 NOTE — Assessment & Plan Note (Signed)
Trial of Tramadol. May use OTC PPI so that she tolerates NSAIDS. Check pelvic sono, may have adenomyosis due to C-section or other ovarian pathology.--may need laparoscopy if nothing found to r/o endometriosis or treat other finding.

## 2017-08-27 ENCOUNTER — Ambulatory Visit (HOSPITAL_COMMUNITY)
Admission: RE | Admit: 2017-08-27 | Discharge: 2017-08-27 | Disposition: A | Discharge status: Home / Self Care | Payer: Self-pay | Source: Ambulatory Visit | Attending: Family Medicine | Admitting: Family Medicine

## 2017-08-27 DIAGNOSIS — N920 Excessive and frequent menstruation with regular cycle: Secondary | ICD-10-CM | POA: Insufficient documentation

## 2017-09-24 ENCOUNTER — Ambulatory Visit: Payer: Medicaid Other | Admitting: Family Medicine

## 2017-09-28 ENCOUNTER — Encounter: Payer: Self-pay | Admitting: Family Medicine

## 2017-09-28 ENCOUNTER — Ambulatory Visit: Payer: Medicaid Other | Admitting: Family Medicine

## 2017-09-28 NOTE — Progress Notes (Signed)
Patient did not keep appointment today. She may call to reschedule.  

## 2017-12-29 IMAGING — CT CT T SPINE W/O CM
2 of 3 series · 11 of 33 positions shown, 13 images · non-contrast
Comparison: Same date thoracic spine radiographs

CLINICAL DATA: Increased thoracic back pain

EXAM:
CT THORACIC SPINE WITHOUT CONTRAST
TECHNIQUE: Multidetector CT imaging of the thoracic spine was performed without
intravenous contrast administration. Multiplanar CT image
reconstructions were also generated.

[Series 4: t spine soft · axial · 0.28mm/px · z∈[-746,-482]mm · 8 of 158 slices shown, 10 images]
[im 13/158  soft-tissue]
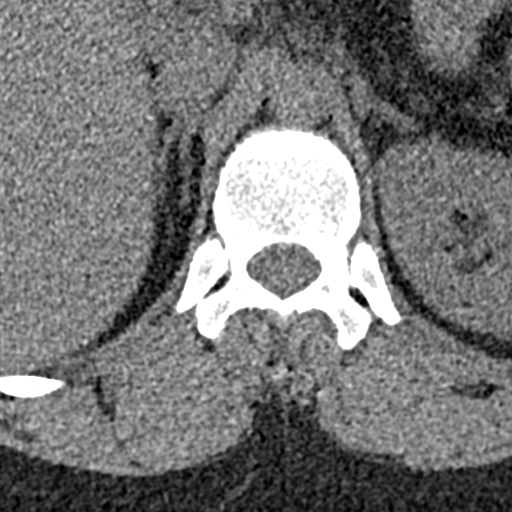
[im 13/158  bone]
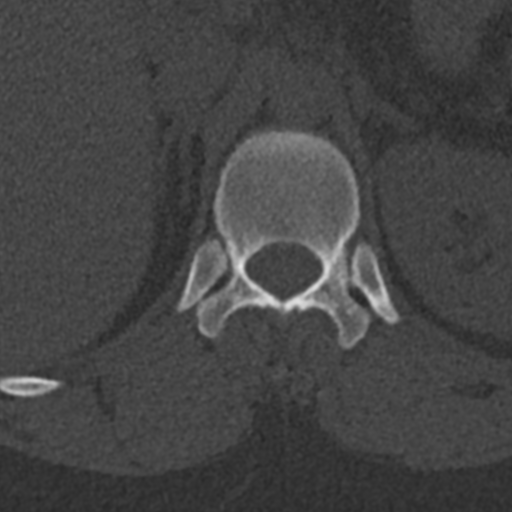
[im 37/158  bone]
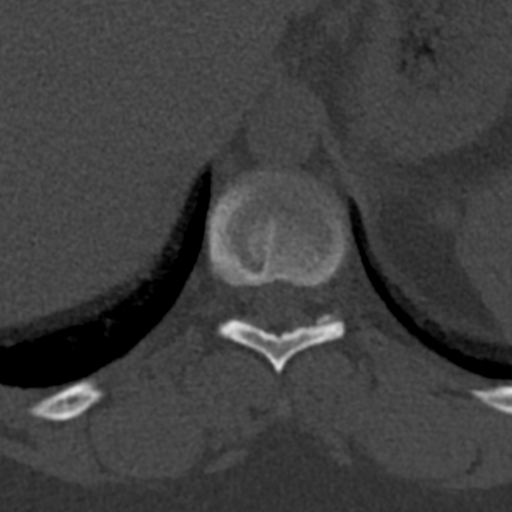
[im 49/158  bone]
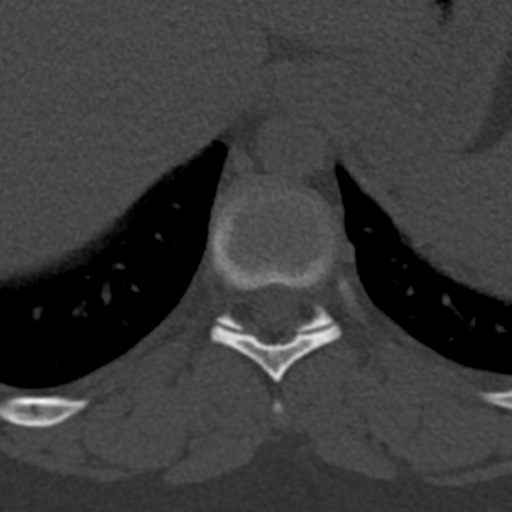
[im 73/158  bone]
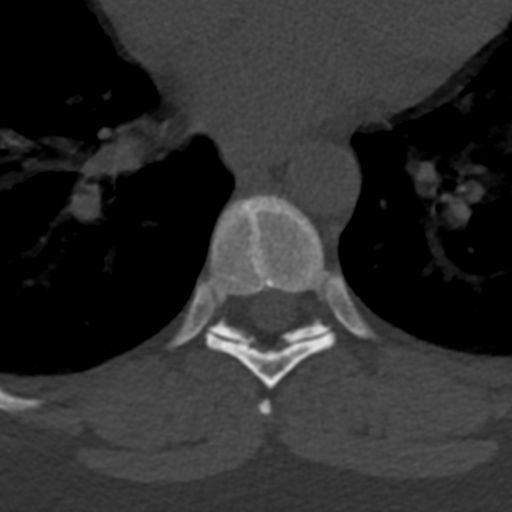
[im 85/158  soft-tissue]
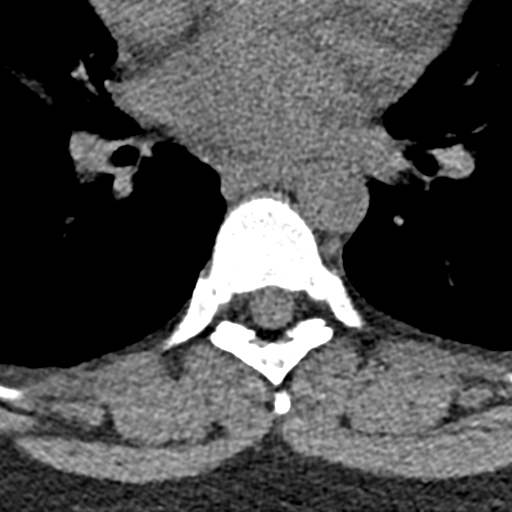
[im 85/158  bone]
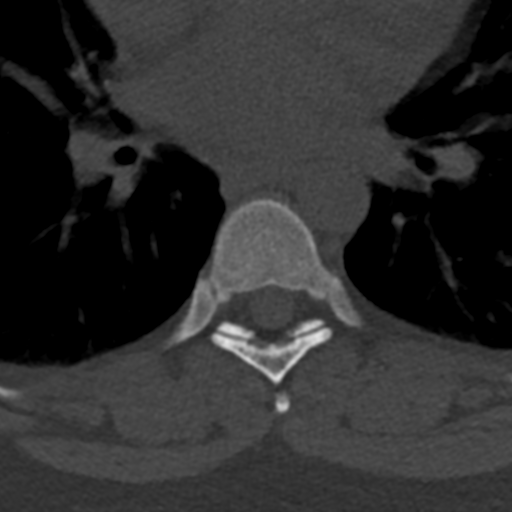
[im 109/158  bone]
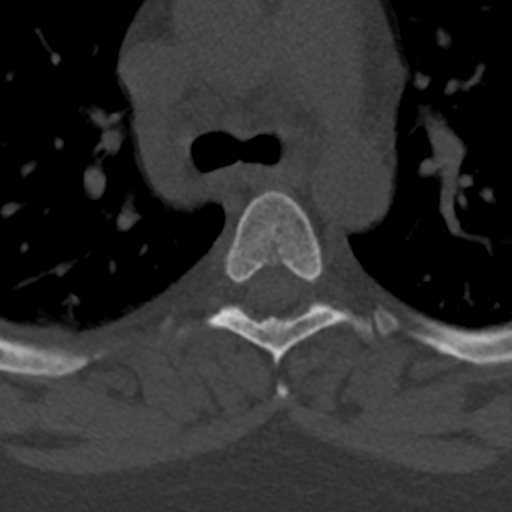
[im 121/158  bone]
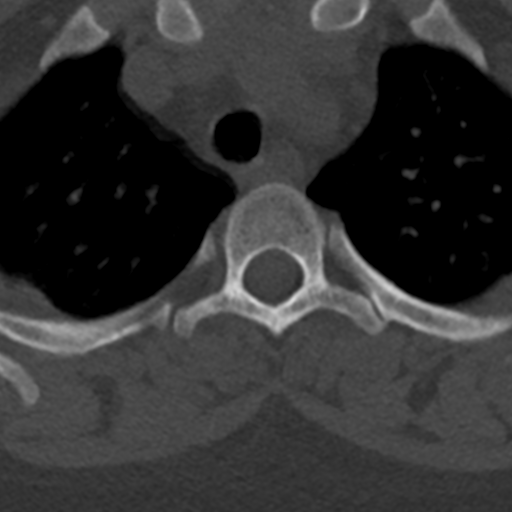
[im 145/158  bone]
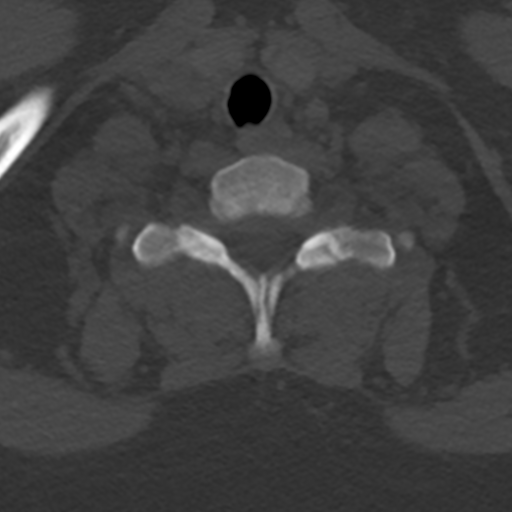

[Series 6: coronal bone · coronal · 0.23mm/px · 3 of 49 slices shown]
[im 10/49  bone]
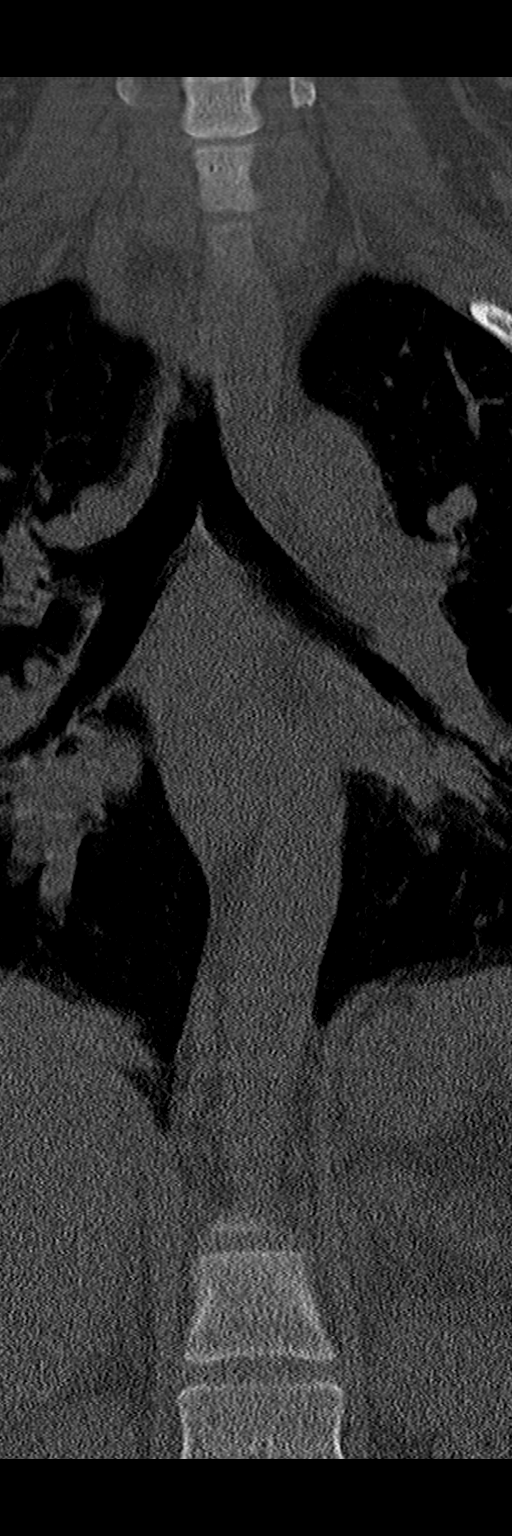
[im 20/49  bone]
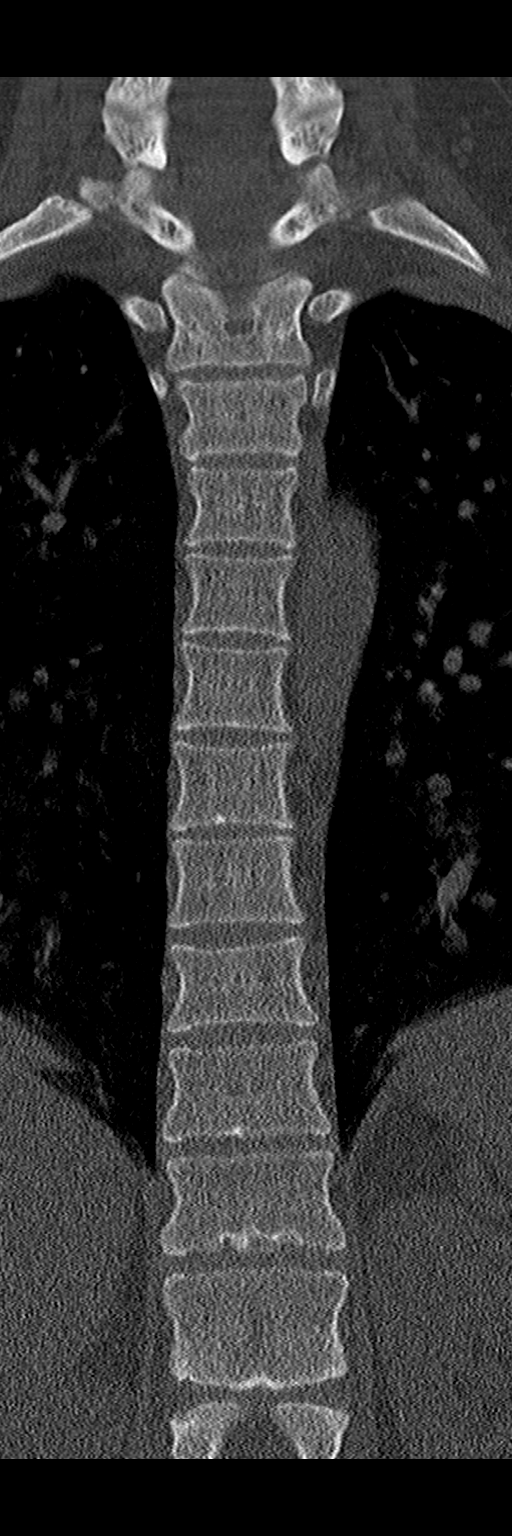
[im 29/49  bone]
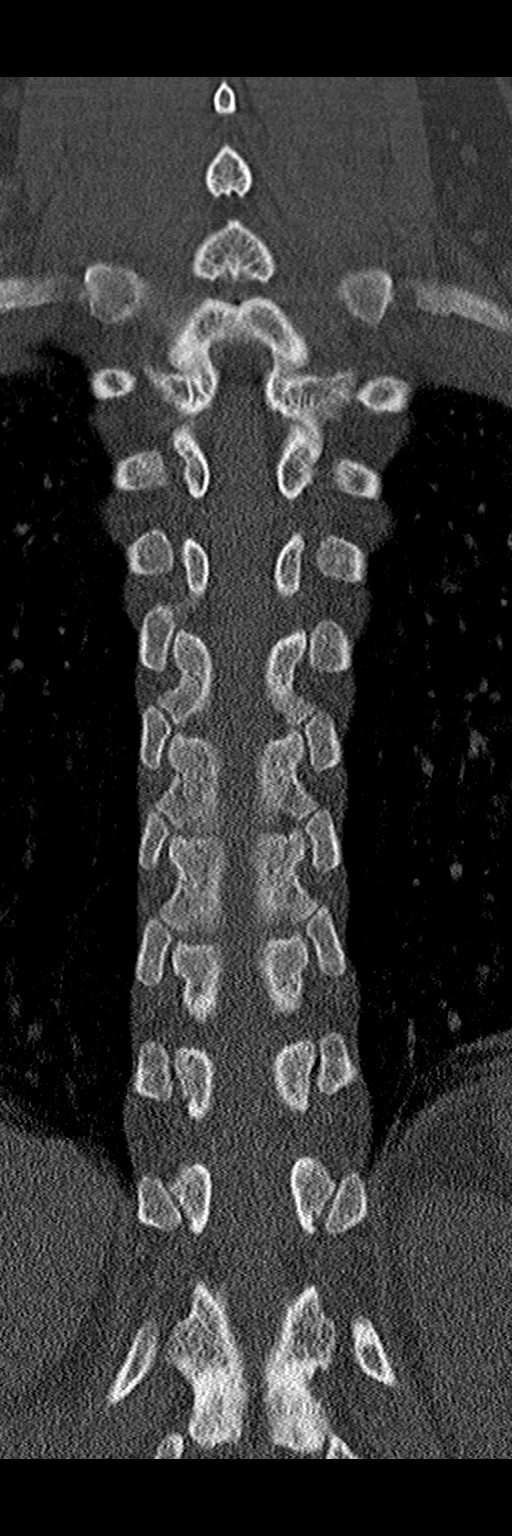

[11 of 33 positions shown; findings below may reference images not displayed]

FINDINGS: Alignment: Normal

Vertebrae: Normal without acute fracture. Small Schmorl's nodes
involving the inferior endplate of T11 and superior endplate of T12.
Linear lucency suggested at the T9 level the radiographs is believed
to be artifactual. No fracture is seen on CT.

Paraspinal and other soft tissues: Negative.

Disc levels: No apparent herniation.
IMPRESSION: No acute osseous abnormality of the thoracic spine.

## 2019-06-21 IMAGING — US US TRANSVAGINAL NON-OB
1 series · 15 of 25 positions shown · non-contrast
Comparison: CT 07/13/2008

CLINICAL DATA: Menorrhagia with regular cycle



[Series 1: us transvaginal non-ob · 43 acquisitions, 15 frames shown]
[im 1/43]
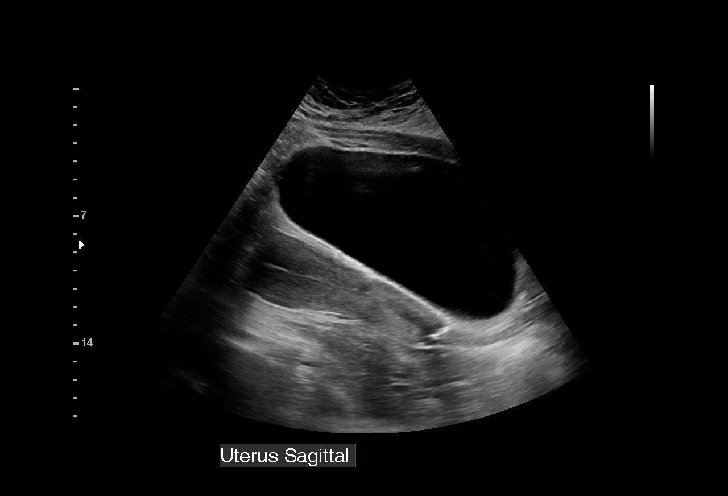
[im 4/43]
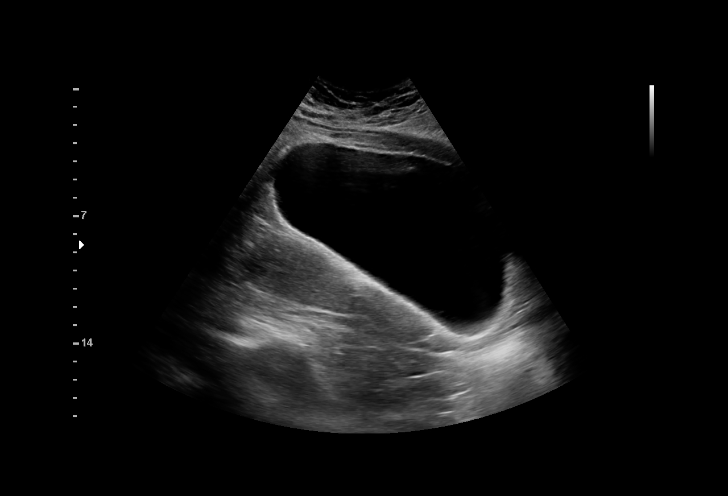
[im 8/43]
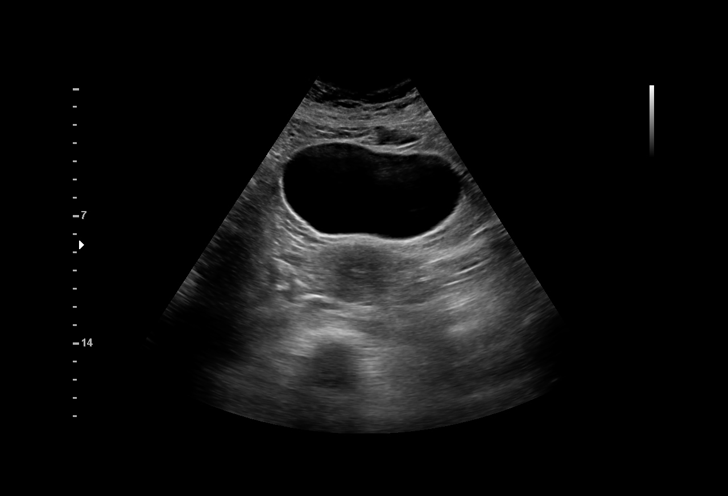
[im 9/43]
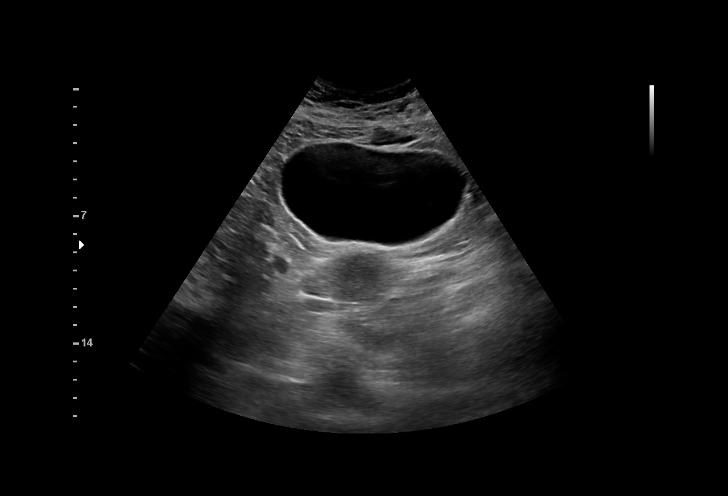
[im 13/43]
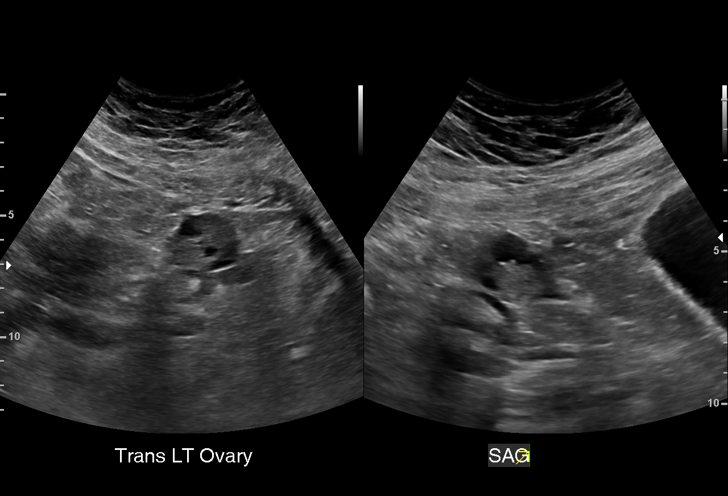
[im 16/43]
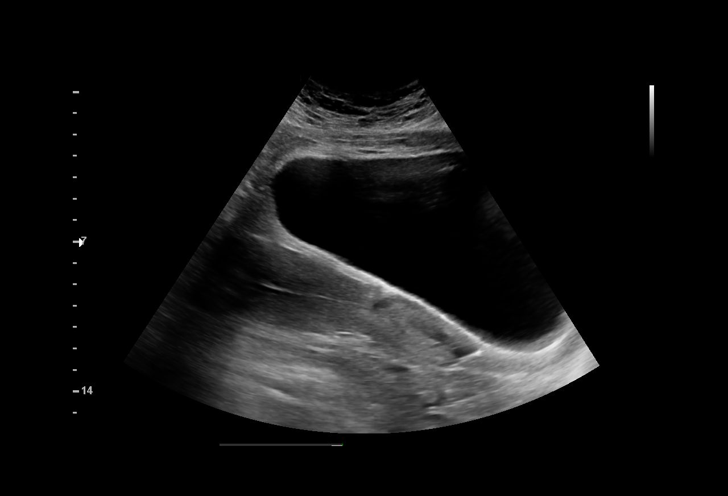
[im 18/43]
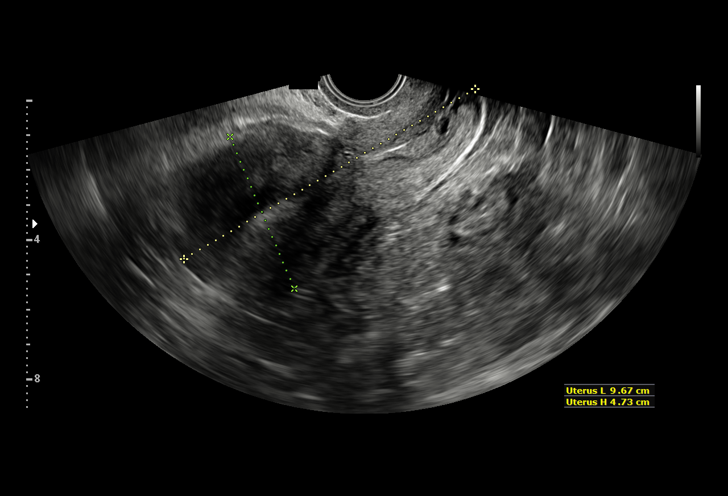
[im 22/43]
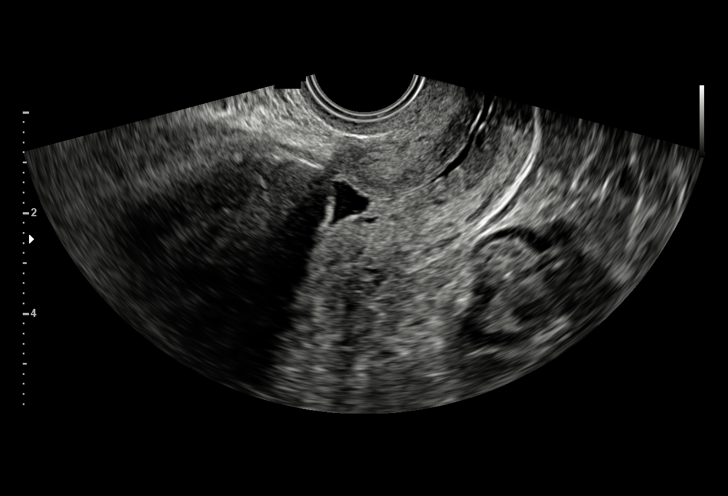
[im 25/43]
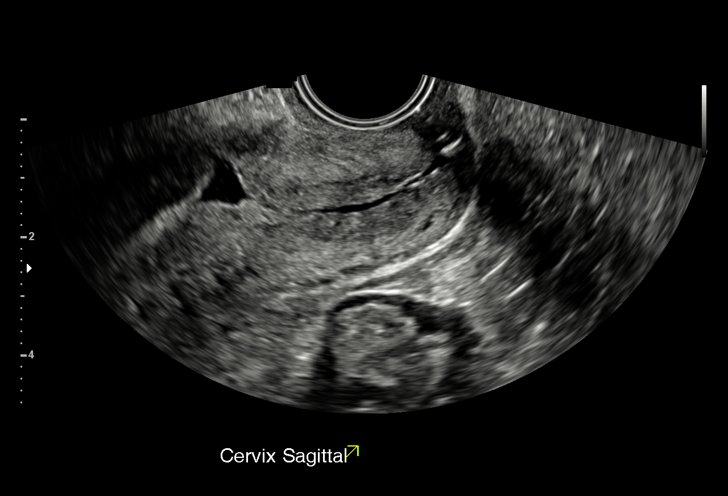
[im 27/43]
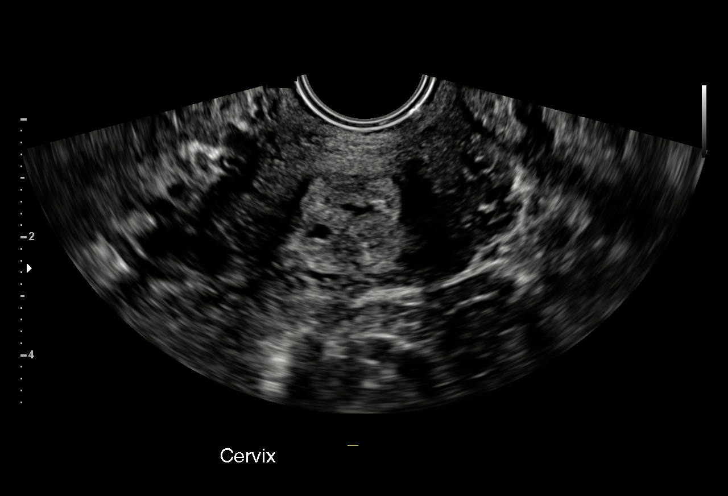
[im 30/43]
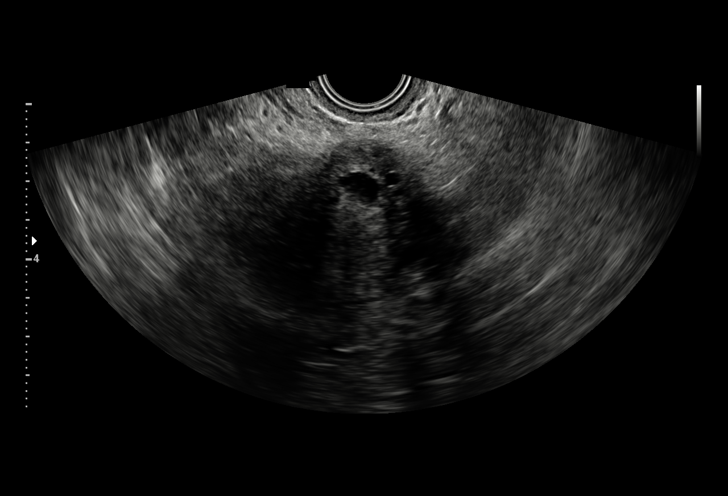
[im 34/43]
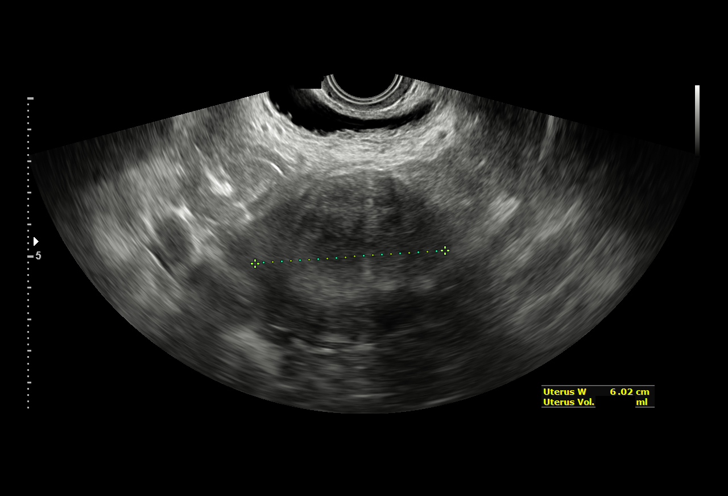
[im 36/43]
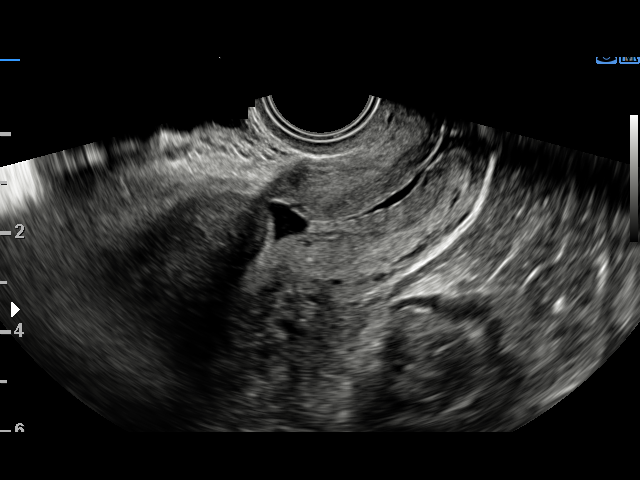
[im 39/43]
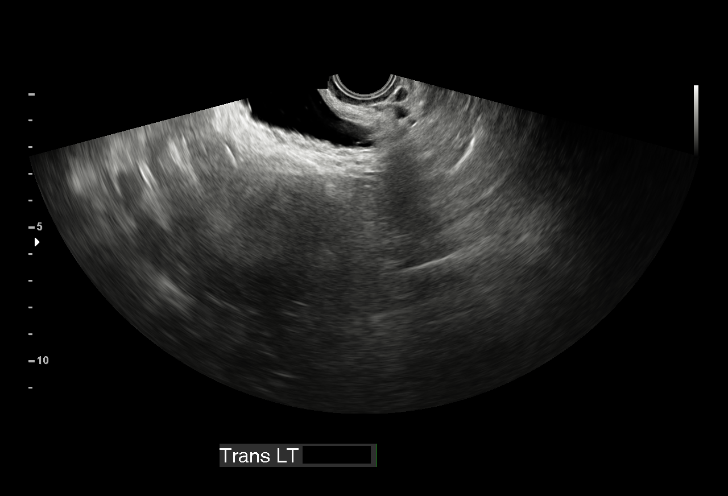
[im 43/43]
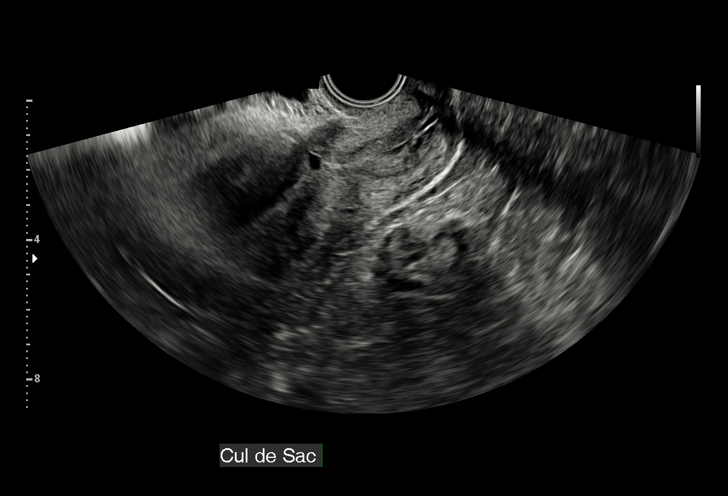

[15 of 25 positions shown; findings below may reference images not displayed]

FINDINGS: Uterus

Measurements: 9.7 x 4.7 x 6.0 cm. No fibroids or other mass
visualized.

Endometrium

Thickness: 5 mm in thickness. Small amount of fluid in the
endometrial canal at the C-section scar. No focal abnormality
visualized.

Right ovary

Measurements: 4.6 x 2.3 x 3.5 cm. Normal appearance/no adnexal mass.

Left ovary

Measurements: 3.2 x 2.1 x 3.1 cm. Normal appearance/no adnexal mass.

Other findings

No abnormal free fluid.
IMPRESSION: Unremarkable pelvic ultrasound.

## 2022-06-24 ENCOUNTER — Ambulatory Visit (INDEPENDENT_AMBULATORY_CARE_PROVIDER_SITE_OTHER): Payer: BC Managed Care – PPO | Admitting: Internal Medicine

## 2022-06-24 ENCOUNTER — Encounter: Payer: Self-pay | Admitting: Internal Medicine

## 2022-06-24 ENCOUNTER — Other Ambulatory Visit: Payer: Self-pay

## 2022-06-24 VITALS — BP 124/76 | HR 76 | Temp 98.6°F | Resp 20 | Ht 66.25 in | Wt 256.5 lb

## 2022-06-24 DIAGNOSIS — L501 Idiopathic urticaria: Secondary | ICD-10-CM | POA: Diagnosis not present

## 2022-06-24 DIAGNOSIS — J3089 Other allergic rhinitis: Secondary | ICD-10-CM

## 2022-06-24 DIAGNOSIS — J310 Chronic rhinitis: Secondary | ICD-10-CM

## 2022-06-24 MED ORDER — FAMOTIDINE 20 MG PO TABS: 20.0000 mg | ORAL_TABLET | Freq: Two times a day (BID) | ORAL | 5 refills | Status: DC | PRN

## 2022-06-24 MED ORDER — FEXOFENADINE HCL 180 MG PO TABS: 180.0000 mg | ORAL_TABLET | Freq: Two times a day (BID) | ORAL | 5 refills | Status: DC | PRN

## 2022-06-24 MED ORDER — FLUTICASONE PROPIONATE 50 MCG/ACT NA SUSP: 2.0000 | Freq: Every day | NASAL | 5 refills | Status: DC

## 2022-06-24 MED ORDER — AZELASTINE HCL 0.1 % NA SOLN: 1.0000 | Freq: Two times a day (BID) | NASAL | 5 refills | Status: DC

## 2022-06-24 NOTE — Patient Instructions (Addendum)
Idiopathic Urticaria (Hives): - At this time etiology of hives and swelling is unknown. Hives can be caused by a variety of different triggers including illness/infection, exercise, pressure, vibrations, extremes of temperature to name a few however majority of the time there is no identifiable trigger.  - Has had normal TSH and negative ANA 05/2022 checked by PCP.  WBC with leukocytosis, thought to be related to prednisone.  -If your hives persist despite anti histamine, we will obtain labs to rule out inflammatory/autoimmune/alpha gal/mast cell causes. -Start Allegra 180mg  twice daily.   -If no improvement in 3 days, add Pepcid 20mg  twice daily and continue Allegra 180mg  twice daily. -If still no improvement in 3 days, increase to Allegra 360mg  twice daily and Pepcid 40mg  twice daily. - Read up on Xolair: http://www.nguyen.biz/.html  Chronic Rhinitis: - Positive skin test 06/2022: none - Use nasal saline rinses before nose sprays such as with Neilmed Sinus Rinse.  Use distilled water.   - Use Flonase 2 sprays each nostril daily. Aim upward and outward. - Use Azelastine 1-2 sprays each nostril twice daily as needed. Aim upward and outward.  Return in about 2 weeks (around 07/08/2022).

## 2022-06-24 NOTE — Progress Notes (Signed)
NEW PATIENT  Date of Service/Encounter:  06/24/22  Consult requested by: [facility], [facility]   Subjective:   Natasha Abbott (DOB: November 14, 1985) is a 37 y.o. female who presents to the clinic on 06/24/2022 with a chief complaint of Establish Care, Allergic Reaction, and Rash (On off rash, hives since November on prednisone ) .    History obtained from: chart review and patient.   Hives/Angioedema: Reports initial onset on November 17th and since then has been persistent.  Hives are itchy, raised and red and sometimes hurt but do not scar.  Happening almost daily and has been on 2 courses of prednisone.  Prednisone resolves it but when she gets off, they reoccur. She has also had lip and eye swelling with the hives.  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   No new medications She does report having a lot of stress. Does report this happened in the past in 2012.  She can't recall exactly how long it lasted.  Never has been on Xolair.  Also noted some pain/tightening that wraps around her abdomen/diaphragm when she gets the hives there.   Rhinitis:  Ongoing for years.  Symptoms include: nasal congestion, rhinorrhea, post nasal drainage, watery eyes, and itchy eyes  Occurs seasonally-Fall Potential triggers: not sure Treatments tried:  Claritin PRN and last use was last Fall  Previous allergy testing: no History of reflux/heartburn: yes on PRN prilosec but rarely has heart burn symptoms  History of sinus surgery: none Nonallergic triggers: none   Past Medical History: Past Medical History:  Diagnosis Date   Gestational diabetes    on insulin with 3rd   Infection    UTI   Kidney stones    Pregnancy induced hypertension    mild elevation with first    Type 2 diabetes mellitus ([place]) 07/17/2017   started on Metformin   Past Surgical History: Past Surgical History:  Procedure Laterality Date    CESAREAN SECTION     CHOLECYSTECTOMY  2016   TUBAL LIGATION     WISDOM TOOTH EXTRACTION  2014    Family History: Family History  Problem Relation Age of Onset   Hypothyroidism Mother    Hypertension Father    Diabetes Father     Social History:  Lives in a 36 year house Flooring in bedroom: tile Pets: dog Tobacco use/exposure: daily marijuana use Job: customer service  Medication List:  Allergies as of 06/24/2022       Reactions   Antihistamines, Diphenhydramine-type Other (See Comments)   Relates that Benadryl and other antihistamines accentuate her sleep paralysis        Medication List        Accurate as of June 24, 2022 11:04 AM. If you have any questions, ask your nurse or doctor.          ALPRAZolam 1 MG tablet Commonly known as: XANAX Take 0.5-1 mg by mouth daily as needed for anxiety (panic attack).   metFORMIN 500 MG tablet Commonly known as: GLUCOPHAGE Take 500 mg by mouth 2 (two) times daily with a meal.   predniSONE 10 MG (21) Tbpk tablet Commonly known as: STERAPRED UNI-PAK 21 TAB TAKE 6 TABLETS ON DAY 1 AS DIRECTED ON PACKAGE AND DECREASE BY 1 TAB EACH DAY FOR A TOTAL OF 6 DAYS   traMADol 50 MG tablet Commonly known as: ULTRAM Take 1 tablet (50 mg total) by mouth every 6 (  six) hours as needed.         REVIEW OF SYSTEMS: Pertinent positives and negatives discussed in HPI.   Objective:   Physical Exam: BP 124/76 (BP Location: Left Arm, Patient Position: Sitting, Cuff Size: Normal)   Pulse 76   Temp 98.6 F (37 C) (Temporal)   Resp 20   Ht 5' 6.25" (1.683 m)   Wt 256 lb 8 oz (116.3 kg)   SpO2 100%   BMI 41.09 kg/m  Body mass index is 41.09 kg/m. GEN: alert, well developed HEENT: clear conjunctiva, TM grey and translucent, nose with + inferior turbinate hypertrophy, pink nasal mucosa, slight clear rhinorrhea, no cobblestoning HEART: regular rate and rhythm, no murmur LUNGS: clear to auscultation bilaterally, no coughing,  unlabored respiration ABDOMEN: soft, non distended  SKIN: few hives noted on arms  Reviewed:  05/19/2021: seen in ER for epigastric pain and positive RSV.  Felt better are fluids and Toradol. Pregnancy and urine test were negative. S/p cholecystectomy.    08/23/2017: seen by obgyn for pelvic pain and abnormal bleeding. Discussed trial of tramadol. Also discussed PPI for possible GERD.  05/2022 PCP labs: TSH 3.97 CBC with WBC with 25k with neutrophilia and lymphocytosis and elevated platelet while on prednisone  Limited food and aeroallergen panel negative  ANA with reflex 11 panel negative    Skin Testing:  Skin prick testing was placed, which includes aeroallergens/foods, histamine control, and saline control.  Verbal consent was obtained prior to placing test.  Patient tolerated procedure well.  Allergy testing results were read and interpreted by myself, documented by clinical staff. Adequate positive and negative control.  Results discussed with patient/family.  Airborne Adult Perc - 06/24/22 0910     Time Antigen Placed 0900    Allergen Manufacturer Lavella Hammock    Location Back    Number of Test 58    Panel 1 Select    1. Control-Buffer 50% Glycerol Negative    2. Control-Histamine 1 mg/ml 3+    3. Albumin saline Negative    4. Colver Negative    5. Guatemala Negative    6. Johnson Negative    7. Scotland Blue Negative    9. Perennial Rye Negative    10. Sweet Vernal Negative    11. Timothy Negative    12. Cocklebur Negative    13. Burweed Marshelder Negative    14. Ragweed, short Negative    15. Ragweed, Giant Negative    16. Plantain,  English Negative    17. Lamb's Quarters Negative    18. Sheep Sorrell Negative    19. Rough Pigweed Negative    20. Marsh Elder, Rough Negative    21. Mugwort, Common Negative    22. Ash mix Negative    23. Birch mix Negative    24. Beech American Negative    25. Box, Elder Negative    26. Cedar, red Negative    27. Cottonwood, [place]  Negative    28. Elm mix Negative    29. Hickory Negative    30. Maple mix Negative    31. Oak, [place] mix Negative    32. Pecan Pollen Negative    33. Pine mix Negative    34. Sycamore Eastern Negative    35. Ringwood, Black Pollen Negative    36. Alternaria alternata Negative    37. Cladosporium Herbarum Negative    38. Aspergillus mix Negative    39. Penicillium mix Negative    40. Bipolaris sorokiniana (Helminthosporium) Negative  41. Drechslera spicifera (Curvularia) Negative    42. Mucor plumbeus Negative    43. Fusarium moniliforme Negative    44. Aureobasidium pullulans (pullulara) Negative    45. Rhizopus oryzae Negative    46. Botrytis cinera Negative    47. Epicoccum nigrum Negative    48. Phoma betae Negative    49. Candida Albicans Negative    50. Trichophyton mentagrophytes Negative    51. Mite, D Farinae  5,000 AU/ml Negative    52. Mite, D Pteronyssinus  5,000 AU/ml Negative    53. Cat Hair 10,000 BAU/ml Negative    54.  Dog Epithelia Negative    55. Mixed Feathers Negative    56. Horse Epithelia Negative    57. Cockroach, German Negative    58. Mouse Negative    59. Tobacco Leaf Negative               Assessment:   1. Idiopathic urticaria   2. Chronic rhinitis     Plan/Recommendations:  Idiopathic Urticaria (Hives): - At this time etiology of hives and swelling is unknown. Hives can be caused by a variety of different triggers including illness/infection, exercise, pressure, vibrations, extremes of temperature to name a few however majority of the time there is no identifiable trigger.  - Has had normal TSH and negative ANA 05/2022 checked by PCP.  WBC with leukocytosis, thought to be related to prednisone but we need to recheck.  Also she has some pain with the hives but no scarring, can consider biopsy if unresponsive to treatment.  Nonspecific pain around her diaphragm related to hives; discussed that if it is resolves with treatment of hives then  it could be related but if it persists, she needs to see PCP as she has a history of multiple ER visits for abdominal pain in the past.   -If your hives persist despite anti histamine, we will obtain labs to rule out inflammatory/autoimmune/alpha gal/mast cell causes. -Start Allegra 180mg  twice daily.   -If no improvement in 3 days, add Pepcid 20mg  twice daily and continue Allegra 180mg  twice daily. -If still no improvement in 3 days, increase to Allegra 360mg  twice daily and Pepcid 40mg  twice daily. - Read up on Xolair: http://www.nguyen.biz/.html - Wean off the prednisone; take 10mg  today and then 5mg  for two days and then stop.    Chronic Rhinitis: - Positive skin test 06/2022: none - Use nasal saline rinses before nose sprays such as with Neilmed Sinus Rinse.  Use distilled water.   - Use Flonase 2 sprays each nostril daily. Aim upward and outward. - Use Azelastine 1-2 sprays each nostril twice daily as needed. Aim upward and outward.  Return in about 2 weeks (around 07/08/2022).  Harlon Flor, MD [facility]

## 2022-06-26 ENCOUNTER — Encounter: Payer: BC Managed Care – PPO | Admitting: Obstetrics and Gynecology

## 2022-07-02 ENCOUNTER — Encounter: Payer: Self-pay | Admitting: Internal Medicine

## 2022-07-03 ENCOUNTER — Other Ambulatory Visit: Payer: Self-pay | Admitting: Internal Medicine

## 2022-07-03 MED ORDER — HYDROXYZINE HCL 25 MG PO TABS: 25.0000 mg | ORAL_TABLET | Freq: Every evening | ORAL | 1 refills | Status: DC | PRN

## 2022-07-03 NOTE — Progress Notes (Signed)
Hydroxyzine sent for hives.

## 2022-07-08 ENCOUNTER — Encounter: Payer: Self-pay | Admitting: Internal Medicine

## 2022-07-08 ENCOUNTER — Ambulatory Visit (INDEPENDENT_AMBULATORY_CARE_PROVIDER_SITE_OTHER): Payer: BC Managed Care – PPO | Admitting: Internal Medicine

## 2022-07-08 ENCOUNTER — Other Ambulatory Visit: Payer: Self-pay

## 2022-07-08 VITALS — BP 118/74 | HR 91 | Temp 98.3°F | Resp 18 | Wt 257.3 lb

## 2022-07-08 DIAGNOSIS — J310 Chronic rhinitis: Secondary | ICD-10-CM | POA: Diagnosis not present

## 2022-07-08 DIAGNOSIS — L501 Idiopathic urticaria: Secondary | ICD-10-CM | POA: Diagnosis not present

## 2022-07-08 MED ORDER — OMALIZUMAB 150 MG ~~LOC~~ SOLR
300.0000 mg | SUBCUTANEOUS | Status: DC
  Administered 2022-07-10: 300 mg via SUBCUTANEOUS

## 2022-07-08 MED ORDER — EPINEPHRINE 0.3 MG/0.3ML IJ SOAJ: 0.3000 mg | INTRAMUSCULAR | 1 refills | Status: DC | PRN

## 2022-07-08 NOTE — Patient Instructions (Addendum)
Idiopathic Urticaria (Hives): - At this time etiology of hives and swelling is unknown. Hives can be caused by a variety of different triggers including illness/infection, exercise, pressure, vibrations, extremes of temperature to name a few however majority of the time there is no identifiable trigger.  - Has had normal TSH and negative ANA 05/2022 checked by PCP.  WBC with leukocytosis, thought to be related to prednisone.  -We will obtain labs to rule out inflammatory/autoimmune/alpha gal/mast cell causes. -Continue Allegra 321m twice daily and Pepcid 475mtwice daily. -Continue Xolair 30036mvery 4 weeks.  First dose given today in clinic and messaged Tammy to start PA.  Pick up Epipen to bring for Xolair shots.  Chronic Rhinitis: - Positive skin test 06/2022: none - Use nasal saline rinses before nose sprays such as with Neilmed Sinus Rinse.  Use distilled water.   - Use Flonase 2 sprays each nostril daily. Aim upward and outward. - Use Azelastine 1-2 sprays each nostril twice daily as needed. Aim upward and outward.  Return in about 3 months (around 10/06/2022).

## 2022-07-08 NOTE — Progress Notes (Signed)
Patient started xolair for hives 358m sample given today at 9am Lot [id number] each arm   Exp date 12/17/2022 Orded by Dr PaPosey Prontoonsent signed and paperwork sent to Tammy V and to scan

## 2022-07-08 NOTE — Progress Notes (Signed)
   FOLLOW UP Date of Service/Encounter:  07/08/22   Subjective:  Natasha Abbott (DOB: 1985-09-20) is a 37 y.o. female who returns to the [facility] on 07/08/2022 for follow up for idiopathic urticaria and chronic rhinitis.  History obtained from: chart review and patient.  Last visit was with me on June 24, 2022 for idiopathic urticaria and anti histamine dose was maxed at the visit.    Since last visit, she has been on maximum dose antihistamines and multiple courses of prednisone.  But still continues to have almost daily flareup of hives.  They are very itchy and raised but not scarring.  Last dose of prednisone was on 06/26/2022.  She is miserable from her hives as she can't work or sleep well with them.  She is ready to start Xolair.  Was worried about the black box warning for anaphylaxis for it.  Past Medical History: Past Medical History:  Diagnosis Date   Gestational diabetes    on insulin with 3rd   Infection    UTI   Kidney stones    Pregnancy induced hypertension    mild elevation with first    Type 2 diabetes mellitus ([place]) 07/17/2017   started on Metformin    Objective:  BP 118/74 (BP Location: Left Arm, Patient Position: Sitting, Cuff Size: Normal)   Pulse 91   Temp 98.3 F (36.8 C) (Temporal)   Resp 18   Wt 257 lb 4.8 oz (116.7 kg)   SpO2 99%   BMI 41.22 kg/m  Body mass index is 41.22 kg/m. Physical Exam: GEN: alert, well developed HEENT: clear conjunctiva, TM grey and translucent, nose with moderate inferior turbinate hypertrophy, pink nasal mucosa, clear rhinorrhea, + cobblestoning HEART: regular rate and rhythm, no murmur LUNGS: clear to auscultation bilaterally, no coughing, unlabored respiration SKIN: + hives on exam bl arms   Assessment:   1. Idiopathic urticaria   2. Chronic rhinitis     Plan/Recommendations:  Idiopathic Urticaria (Hives): - Remains uncontrolled with frequent flare ups despite high dose anti histamines and  prednisone.  Will start Xolair.  - At this time etiology of hives and swelling is unknown. Hives can be caused by a variety of different triggers including illness/infection, exercise, pressure, vibrations, extremes of temperature to name a few however majority of the time there is no identifiable trigger.  - Has had normal TSH and negative ANA 05/2022 checked by PCP.  WBC with leukocytosis, thought to be related to prednisone.  -We will obtain labs to rule out inflammatory/autoimmune/alpha gal/mast cell causes. -Continue Allegra 37m twice daily and Pepcid 490mtwice daily. -Continue Xolair 30067mvery 4 weeks.  First dose given today in clinic and messaged Tammy to start PA.  Observed for 30 minutes after. Pick up Epipen to bring for Xolair shots.  Chronic Rhinitis: - Positive skin test 06/2022: none - Use nasal saline rinses before nose sprays such as with Neilmed Sinus Rinse.  Use distilled water.   - Use Flonase 2 sprays each nostril daily. Aim upward and outward. - Use Azelastine 1-2 sprays each nostril twice daily as needed. Aim upward and outward.  Return in about 3 months (around 10/06/2022).  PriHarlon FlorD [facility]

## 2022-07-09 LAB — CBC WITH DIFFERENTIAL/PLATELET
Eos: 1 %
MCH: 24.1 pg — ABNORMAL LOW (ref 26.6–33.0)
MCHC: 31.3 g/dL — ABNORMAL LOW (ref 31.5–35.7)
Monocytes Absolute: 1 10*3/uL — ABNORMAL HIGH (ref 0.1–0.9)
RBC: 4.48 x10E6/uL (ref 3.77–5.28)

## 2022-07-09 LAB — ALPHA-GAL PANEL

## 2022-07-09 LAB — CMP14+EGFR
BUN/Creatinine Ratio: 18 (ref 9–23)
CO2: 20 mmol/L (ref 20–29)
Calcium: 9.2 mg/dL (ref 8.7–10.2)
Creatinine, Ser: 0.72 mg/dL (ref 0.57–1.00)

## 2022-07-10 LAB — ALPHA-GAL PANEL

## 2022-07-10 LAB — CMP14+EGFR
ALT: 16 IU/L (ref 0–32)
AST: 15 IU/L (ref 0–40)
Albumin/Globulin Ratio: 1.7 (ref 1.2–2.2)
Alkaline Phosphatase: 88 IU/L (ref 44–121)
Glucose: 115 mg/dL — ABNORMAL HIGH (ref 70–99)

## 2022-07-10 LAB — CBC WITH DIFFERENTIAL/PLATELET
EOS (ABSOLUTE): 0.1 10*3/uL (ref 0.0–0.4)
Hematocrit: 34.5 % (ref 34.0–46.6)
Lymphocytes Absolute: 3.9 10*3/uL — ABNORMAL HIGH (ref 0.7–3.1)
WBC: 13.4 10*3/uL — ABNORMAL HIGH (ref 3.4–10.8)

## 2022-07-10 LAB — TRYPTASE: Tryptase: 4.6 ug/L (ref 2.2–13.2)

## 2022-07-11 LAB — CBC WITH DIFFERENTIAL/PLATELET
Basos: 0 %
Hemoglobin: 10.8 g/dL — ABNORMAL LOW (ref 11.1–15.9)
Immature Grans (Abs): 0.1 10*3/uL (ref 0.0–0.1)
Immature Granulocytes: 1 %
Lymphs: 29 %
Monocytes: 8 %
Neutrophils Absolute: 8.2 10*3/uL — ABNORMAL HIGH (ref 1.4–7.0)
Neutrophils: 61 %
RDW: 13.9 % (ref 11.7–15.4)

## 2022-07-11 LAB — CMP14+EGFR
Albumin: 4.3 g/dL (ref 3.9–4.9)
BUN: 13 mg/dL (ref 6–20)
Potassium: 4.6 mmol/L (ref 3.5–5.2)
Sodium: 139 mmol/L (ref 134–144)
eGFR: 111 mL/min/{1.73_m2} (ref >59)

## 2022-07-11 LAB — ALPHA-GAL PANEL: Allergen Lamb IgE: <0.1 kU/L

## 2022-07-11 LAB — CHRONIC URTICARIA

## 2022-07-13 ENCOUNTER — Other Ambulatory Visit (HOSPITAL_COMMUNITY): Payer: Self-pay

## 2022-07-13 ENCOUNTER — Telehealth: Payer: Self-pay | Admitting: *Deleted

## 2022-07-13 ENCOUNTER — Other Ambulatory Visit: Payer: Self-pay

## 2022-07-13 MED ORDER — OMALIZUMAB 150 MG/ML ~~LOC~~ SOSY
300.0000 mg | PREFILLED_SYRINGE | SUBCUTANEOUS | 11 refills | Status: DC
  Filled 2022-07-13: qty 2, 28d supply, fill #0
  Filled 2022-08-19 – 2022-09-07 (×2): qty 2, 28d supply, fill #1

## 2022-07-13 NOTE — Telephone Encounter (Signed)
L/m for patient to contact me to advise approval, copay card and submit to [facility]

## 2022-07-13 NOTE — Telephone Encounter (Signed)
Spoke to patient and advised submit to [facility] will reach out once delivery set to make appt for next injection

## 2022-07-13 NOTE — Telephone Encounter (Signed)
-----   Message from Larose Kells, MD sent at 07/08/2022  8:40 AM EST ----- Merton Border I would like her to start Xolair 32m every 4 weeks for uncontrolled idiopathic urticaria  She has been on maximum anti histamines and prednisone and still flaring up. We gave a sample today in [place] 3069m Tahn[place] you

## 2022-07-14 ENCOUNTER — Other Ambulatory Visit (HOSPITAL_COMMUNITY): Payer: Self-pay

## 2022-07-15 ENCOUNTER — Other Ambulatory Visit (HOSPITAL_COMMUNITY): Payer: Self-pay

## 2022-07-15 ENCOUNTER — Other Ambulatory Visit: Payer: Self-pay

## 2022-07-17 ENCOUNTER — Other Ambulatory Visit (HOSPITAL_COMMUNITY): Payer: Self-pay

## 2022-07-17 LAB — CBC WITH DIFFERENTIAL/PLATELET
Basophils Absolute: 0 10*3/uL (ref 0.0–0.2)
MCV: 77 fL — ABNORMAL LOW (ref 79–97)
Platelets: 591 10*3/uL — ABNORMAL HIGH (ref 150–450)

## 2022-07-17 LAB — CMP14+EGFR
Bilirubin Total: 0.2 mg/dL (ref 0.0–1.2)
Chloride: 104 mmol/L (ref 96–106)
Globulin, Total: 2.5 g/dL (ref 1.5–4.5)
Total Protein: 6.8 g/dL (ref 6.0–8.5)

## 2022-07-20 ENCOUNTER — Other Ambulatory Visit: Payer: Self-pay

## 2022-07-21 ENCOUNTER — Encounter: Payer: BC Managed Care – PPO | Admitting: Obstetrics and Gynecology

## 2022-07-28 ENCOUNTER — Other Ambulatory Visit (HOSPITAL_COMMUNITY): Payer: Self-pay

## 2022-07-29 ENCOUNTER — Other Ambulatory Visit: Payer: Self-pay | Admitting: Internal Medicine

## 2022-08-05 ENCOUNTER — Ambulatory Visit (INDEPENDENT_AMBULATORY_CARE_PROVIDER_SITE_OTHER): Payer: Medicaid Other

## 2022-08-05 DIAGNOSIS — L501 Idiopathic urticaria: Secondary | ICD-10-CM | POA: Diagnosis not present

## 2022-08-05 MED ORDER — OMALIZUMAB 150 MG/ML ~~LOC~~ SOSY
300.0000 mg | PREFILLED_SYRINGE | SUBCUTANEOUS | Status: AC
  Administered 2022-08-05 – 2022-12-30 (×10): 300 mg via SUBCUTANEOUS

## 2022-08-06 ENCOUNTER — Other Ambulatory Visit (HOSPITAL_COMMUNITY): Payer: Self-pay

## 2022-08-13 ENCOUNTER — Other Ambulatory Visit: Payer: Self-pay

## 2022-08-13 MED ORDER — FAMOTIDINE 20 MG PO TABS: 20.0000 mg | ORAL_TABLET | Freq: Two times a day (BID) | ORAL | 5 refills | Status: DC

## 2022-08-17 ENCOUNTER — Other Ambulatory Visit: Payer: Self-pay

## 2022-08-17 MED ORDER — FAMOTIDINE 20 MG PO TABS: 40.0000 mg | ORAL_TABLET | Freq: Two times a day (BID) | ORAL | 1 refills | Status: DC

## 2022-08-18 ENCOUNTER — Other Ambulatory Visit: Payer: Self-pay | Admitting: Internal Medicine

## 2022-08-18 ENCOUNTER — Encounter: Payer: Self-pay | Admitting: Internal Medicine

## 2022-08-18 MED ORDER — PREDNISONE 10 MG PO TABS: ORAL_TABLET | ORAL | 0 refills | Status: AC

## 2022-08-18 MED ORDER — FEXOFENADINE HCL 180 MG PO TABS: 360.0000 mg | ORAL_TABLET | Freq: Two times a day (BID) | ORAL | 5 refills | Status: DC | PRN

## 2022-08-18 NOTE — Progress Notes (Signed)
Urticaria flare up, sending in prednisone.

## 2022-08-18 NOTE — Progress Notes (Signed)
Work excuse letter placed.

## 2022-08-19 ENCOUNTER — Other Ambulatory Visit (HOSPITAL_COMMUNITY): Payer: Self-pay

## 2022-08-20 ENCOUNTER — Other Ambulatory Visit (HOSPITAL_COMMUNITY): Payer: Self-pay

## 2022-08-20 NOTE — Telephone Encounter (Signed)
Passing the message on to Dr. Posey Pronto since she will be back in the office tomorrow. Sometimes it takes up to 3 Xolair injections to get benefit. There is also the option of cyclosporine.

## 2022-08-20 NOTE — Telephone Encounter (Signed)
There is not really ano other medications to try. Is she still taking Allegra 360 mg twice a day, Pepcid 40 mg twice a day and hydroxyzine as needed?  Is she taking prednisone. If the hives continue to be a problem you can discuss possibly changing the frequency of Xolair, but she has only had one Xolair injection so far.  Will send to Dr. Posey Pronto also to keep her in the loop. She  will be back in the office tomorrow.

## 2022-08-27 ENCOUNTER — Other Ambulatory Visit (HOSPITAL_COMMUNITY): Payer: Self-pay

## 2022-08-28 ENCOUNTER — Other Ambulatory Visit: Payer: Self-pay

## 2022-08-31 ENCOUNTER — Other Ambulatory Visit: Payer: Self-pay

## 2022-08-31 ENCOUNTER — Other Ambulatory Visit (HOSPITAL_COMMUNITY): Payer: Self-pay

## 2022-08-31 ENCOUNTER — Telehealth: Payer: Self-pay | Admitting: *Deleted

## 2022-08-31 NOTE — Telephone Encounter (Signed)
Called patient to find out what her ins is since her primary coverage we had is inactive. She gave me new policy number to get approval and I advised her may delay her Xolair due to Ins change she needs to call before coming in for injs

## 2022-08-31 NOTE — Telephone Encounter (Signed)
-----   Message from Birder Robson, MD sent at 08/21/2022 12:00 PM EDT ----- Regarding: Xolair Dosing Hey Natasha Abbott  Would it be possible to switch her to Xolair 300mg  every 2 weeks for urticaria?  She has gotten 2 shots so far but still breaking out and having severe flare ups.  I had to call in prednisone just now. I wasn't sure if insurance allowed/covered this.  Thanks [employer]

## 2022-09-01 ENCOUNTER — Other Ambulatory Visit (HOSPITAL_COMMUNITY): Payer: Self-pay

## 2022-09-02 ENCOUNTER — Ambulatory Visit (INDEPENDENT_AMBULATORY_CARE_PROVIDER_SITE_OTHER): Payer: BC Managed Care – PPO | Admitting: Internal Medicine

## 2022-09-02 ENCOUNTER — Other Ambulatory Visit: Payer: Self-pay

## 2022-09-02 ENCOUNTER — Other Ambulatory Visit (HOSPITAL_COMMUNITY): Payer: Self-pay

## 2022-09-02 ENCOUNTER — Ambulatory Visit: Payer: Medicaid Other

## 2022-09-02 ENCOUNTER — Encounter: Payer: Self-pay | Admitting: Internal Medicine

## 2022-09-02 VITALS — BP 98/64 | HR 82 | Temp 97.8°F | Resp 16

## 2022-09-02 DIAGNOSIS — L501 Idiopathic urticaria: Secondary | ICD-10-CM

## 2022-09-02 DIAGNOSIS — J310 Chronic rhinitis: Secondary | ICD-10-CM

## 2022-09-02 MED ORDER — PREDNISONE 10 MG PO TABS: ORAL_TABLET | ORAL | 0 refills | Status: AC

## 2022-09-02 MED ORDER — FLUTICASONE PROPIONATE 50 MCG/ACT NA SUSP: 2.0000 | Freq: Every day | NASAL | 5 refills | Status: DC

## 2022-09-02 MED ORDER — AZELASTINE HCL 0.1 % NA SOLN: 1.0000 | Freq: Two times a day (BID) | NASAL | 5 refills | Status: DC

## 2022-09-02 MED ORDER — FAMOTIDINE 40 MG PO TABS: 40.0000 mg | ORAL_TABLET | Freq: Two times a day (BID) | ORAL | 5 refills | Status: DC

## 2022-09-02 MED ORDER — MONTELUKAST SODIUM 10 MG PO TABS: 10.0000 mg | ORAL_TABLET | Freq: Every day | ORAL | 5 refills | Status: AC

## 2022-09-02 MED ORDER — FEXOFENADINE HCL 180 MG PO TABS: 360.0000 mg | ORAL_TABLET | Freq: Two times a day (BID) | ORAL | 5 refills | Status: DC | PRN

## 2022-09-02 MED ORDER — HYDROXYZINE HCL 10 MG PO TABS: 10.0000 mg | ORAL_TABLET | Freq: Three times a day (TID) | ORAL | 5 refills | Status: DC | PRN

## 2022-09-02 NOTE — Progress Notes (Signed)
FOLLOW UP Date of Service/Encounter:  09/02/22   Subjective:  Natasha Abbott (DOB: 09/28/1985) is a 37 y.o. female who [facility] for follow up for an acute visit.   History obtained from: chart review and patient. Last seen by me on July 08, 2022 and at that time was started on Xolair for uncontrolled idiopathic urticaria despite high-dose antihistamines and frequent courses of oral prednisone.  Since last visit, she has had 2 shots of Xolair.  Reports after the first shot her hives were doing better for about 3 weeks but then returned.  She is not sure if the second shot helped as much she had to get back on oral prednisone taper that we sent in.  Also has had pretty bad flareup starting this week which is a little calmer today.  Also has noted swelling around the eyes with the hives.  She has been taking Allegra 360 mg twice daily and Pepcid 40 mg twice daily.  Never tried Singulair.  Past Medical History: Past Medical History:  Diagnosis Date   Gestational diabetes    on insulin with 3rd   Infection    UTI   Kidney stones    Pregnancy induced hypertension    mild elevation with first    Type 2 diabetes mellitus 07/17/2017   started on Metformin    Objective:  BP 98/64   Pulse 82   Temp 97.8 F (36.6 C) (Temporal)   Resp 16   SpO2 98%  There is no height or weight on file to calculate BMI. Physical Exam: GEN: alert, well developed HEENT: clear conjunctiva, MMM HEART: regular rate and rhythm, no murmur LUNGS: clear to auscultation bilaterally, no coughing, unlabored respiration SKIN: hives present on bilateral arms   Assessment:   1. Idiopathic urticaria   2. Chronic rhinitis     Plan/Recommendations:   Idiopathic Urticaria (Hives): - Uncontrolled with acute flare up despite Xolair.     - At this time etiology of hives and swelling is unknown. Hives can be caused by a variety of different triggers including  illness/infection, exercise, pressure, vibrations, extremes of temperature to name a few however majority of the time there is no identifiable trigger.  - WBC with leukocytosis by PCP, thought to be related to prednisone. Improved WBC on recheck. Will not recheck today due to recent steroid course.  Will obtain SPEP and FLC.   - CU index elevated 06/2022.  Negative ANA previously. - Continue Allegra  twice daily and Pepcid  twice daily. - Use hydroxyzine  nightly as needed. Can try a dose in the daytime also if not sleepy.  - Start Singulair  daily. Stop if there are any behavioral/mood changes. - We will try to do Xolair  every 2 weeks.  Bring Epipen. Given Xolair sample today.   - Will send in Prednisone to keep on hand.   - Refer to Dermatology for possible biopsy to rule out urticarial vasculitis due to uncontrolled hives despite Xolair. Also has fatigue and nonspecific pain.  - Did discuss next step would be an immunosuppressant such as Plaquenil or Cyclosporine.  Chronic Rhinitis: - Positive skin test 06/2022: none - Use nasal saline rinses before nose sprays such as with Neilmed Sinus Rinse.  Use distilled water.   - Use Flonase 2 sprays each nostril daily. Aim upward and outward. - Use Azelastine 1-2 sprays each nostril twice daily as needed. Aim upward and outward.  Return in about  6 weeks (around 10/14/2022).  Alesia Morin, MD [facility]

## 2022-09-02 NOTE — Patient Instructions (Addendum)
Natasha Abbott  Idiopathic Urticaria (Hives): - At this time etiology of hives and swelling is unknown. Hives can be caused by a variety of different triggers including illness/infection, exercise, pressure, vibrations, extremes of temperature to name a few however majority of the time there is no identifiable trigger.  - WBC with leukocytosis, thought to be related to prednisone. Improved WBC on recheck.  - CU index elevated 06/2022.   -Continue Allegra  twice daily and Pepcid  twice daily. - Use hydroxyzine  nightly as needed. Can try a dose in the daytime also if not sleepy.  - Start Singulair  daily. Stop if there are any behavioral/mood changes. - We will try to do Xolair  every 2 weeks.  Bring Epipen.  - Will send in Prednisone to keep on hand.    Chronic Rhinitis: - Positive skin test 06/2022: none - Use nasal saline rinses before nose sprays such as with Neilmed Sinus Rinse.  Use distilled water.   - Use Flonase 2 sprays each nostril daily. Aim upward and outward. - Use Azelastine 1-2 sprays each nostril twice daily as needed. Aim upward and outward.  Return in about 6 weeks (around 10/14/2022).

## 2022-09-03 ENCOUNTER — Other Ambulatory Visit: Payer: Self-pay

## 2022-09-03 LAB — PE AND FLC, SERUM
Ig Lambda Free Light Chain: 24.9 mg/L (ref 5.7–26.3)
KAPPA/LAMBDA RATIO: 0.93 (ref 0.26–1.65)

## 2022-09-04 ENCOUNTER — Other Ambulatory Visit: Payer: Self-pay

## 2022-09-04 ENCOUNTER — Other Ambulatory Visit (HOSPITAL_COMMUNITY): Payer: Self-pay

## 2022-09-07 ENCOUNTER — Other Ambulatory Visit: Payer: Self-pay

## 2022-09-08 LAB — PE AND FLC, SERUM
A/G Ratio: 1.2 (ref 0.7–1.7)
Alpha 1: 0.3 g/dL (ref 0.0–0.4)
Alpha 2: 0.8 g/dL (ref 0.4–1.0)
Globulin, Total: 3.2 g/dL (ref 2.2–3.9)
Ig Kappa Free Light Chain: 23.1 mg/L — ABNORMAL HIGH (ref 3.3–19.4)
Total Protein: 7 g/dL (ref 6.0–8.5)

## 2022-09-08 NOTE — Telephone Encounter (Signed)
Spoke to patient and advised delivery under new ins for Xolair

## 2022-09-09 ENCOUNTER — Telehealth: Payer: Self-pay

## 2022-09-09 ENCOUNTER — Encounter: Payer: Self-pay | Admitting: Internal Medicine

## 2022-09-09 NOTE — Addendum Note (Signed)
Addended by: Devoria Glassing on: 09/09/2022 10:15 AM   Modules accepted: Orders

## 2022-09-09 NOTE — Telephone Encounter (Signed)
-----   Message from Birder Robson, MD sent at 09/02/2022  1:08 PM EDT ----- Hello Would like for her to see Derm for idiopathic urticaria on Xolair, uncontrolled.  Need biopsy to rule out vasculitis. THanks

## 2022-09-17 ENCOUNTER — Other Ambulatory Visit: Payer: Self-pay | Admitting: *Deleted

## 2022-09-17 ENCOUNTER — Ambulatory Visit (INDEPENDENT_AMBULATORY_CARE_PROVIDER_SITE_OTHER): Payer: BC Managed Care – PPO | Admitting: *Deleted

## 2022-09-17 ENCOUNTER — Other Ambulatory Visit: Payer: Self-pay

## 2022-09-17 ENCOUNTER — Other Ambulatory Visit (HOSPITAL_COMMUNITY): Payer: Self-pay

## 2022-09-17 DIAGNOSIS — L501 Idiopathic urticaria: Secondary | ICD-10-CM

## 2022-09-17 MED ORDER — OMALIZUMAB 150 MG/ML ~~LOC~~ SOSY
300.0000 mg | PREFILLED_SYRINGE | SUBCUTANEOUS | 11 refills | Status: AC
  Filled 2022-09-17 – 2022-10-06 (×3): qty 4, 28d supply, fill #0
  Filled 2022-11-04: qty 4, 28d supply, fill #1
  Filled 2022-12-02: qty 4, 28d supply, fill #2
  Filled 2023-01-08: qty 4, 28d supply, fill #3
  Filled 2023-02-11: qty 4, 28d supply, fill #4

## 2022-09-21 ENCOUNTER — Other Ambulatory Visit (HOSPITAL_COMMUNITY): Payer: Self-pay

## 2022-10-01 ENCOUNTER — Ambulatory Visit (INDEPENDENT_AMBULATORY_CARE_PROVIDER_SITE_OTHER): Payer: BC Managed Care – PPO | Admitting: *Deleted

## 2022-10-01 DIAGNOSIS — L501 Idiopathic urticaria: Secondary | ICD-10-CM | POA: Diagnosis not present

## 2022-10-06 ENCOUNTER — Other Ambulatory Visit (HOSPITAL_COMMUNITY): Payer: Self-pay

## 2022-10-06 ENCOUNTER — Other Ambulatory Visit: Payer: Self-pay

## 2022-10-07 ENCOUNTER — Ambulatory Visit (INDEPENDENT_AMBULATORY_CARE_PROVIDER_SITE_OTHER): Payer: BC Managed Care – PPO | Admitting: Internal Medicine

## 2022-10-07 ENCOUNTER — Encounter: Payer: Self-pay | Admitting: Internal Medicine

## 2022-10-07 VITALS — BP 110/60 | HR 71 | Temp 97.2°F | Resp 16

## 2022-10-07 DIAGNOSIS — H6993 Unspecified Eustachian tube disorder, bilateral: Secondary | ICD-10-CM | POA: Diagnosis not present

## 2022-10-07 DIAGNOSIS — L501 Idiopathic urticaria: Secondary | ICD-10-CM | POA: Diagnosis not present

## 2022-10-07 DIAGNOSIS — J310 Chronic rhinitis: Secondary | ICD-10-CM

## 2022-10-07 MED ORDER — AZELASTINE HCL 0.1 % NA SOLN: 1.0000 | Freq: Two times a day (BID) | NASAL | 5 refills | Status: AC

## 2022-10-07 MED ORDER — FEXOFENADINE HCL 180 MG PO TABS: 360.0000 mg | ORAL_TABLET | Freq: Two times a day (BID) | ORAL | 5 refills | Status: AC | PRN

## 2022-10-07 MED ORDER — HYDROXYZINE HCL 10 MG PO TABS: 10.0000 mg | ORAL_TABLET | Freq: Three times a day (TID) | ORAL | 5 refills | Status: AC | PRN

## 2022-10-07 MED ORDER — FAMOTIDINE 40 MG PO TABS: 40.0000 mg | ORAL_TABLET | Freq: Two times a day (BID) | ORAL | 5 refills | Status: AC

## 2022-10-07 MED ORDER — EPINEPHRINE 0.3 MG/0.3ML IJ SOAJ: 0.3000 mg | INTRAMUSCULAR | 1 refills | Status: AC | PRN

## 2022-10-07 MED ORDER — FLUTICASONE PROPIONATE 50 MCG/ACT NA SUSP: 2.0000 | Freq: Every day | NASAL | 5 refills | Status: AC

## 2022-10-07 NOTE — Patient Instructions (Addendum)
Natasha Abbott Return in about 3 months (around 01/07/2023).    Idiopathic Urticaria (Hives): - At this time etiology of hives and swelling is unknown. Hives can be caused by a variety of different triggers including illness/infection, exercise, pressure, vibrations, extremes of temperature to name a few however majority of the time there is no identifiable trigger.  - Repeat WBC with PCP has normalized.  - CU index elevated 06/2022.   - Continue Allegra 360mg  twice daily and Pepcid 40mg  twice daily. - Use hydroxyzine 10mg  nightly as needed. Can try a dose in the daytime also if not sleepy.  - Continue Xolair 300mg  every 2 weeks.  Bring Epipen.   Chronic Rhinitis: - Positive skin test 06/2022: none - Use nasal saline rinses before nose sprays such as with Neilmed Sinus Rinse.  Use distilled water.   - Use Flonase 2 sprays each nostril daily. Aim upward and outward. - Use Azelastine 1-2 sprays each nostril twice daily as needed. Aim upward and outward.  No follow-ups on file.

## 2022-10-07 NOTE — Progress Notes (Signed)
   FOLLOW UP Date of Service/Encounter:  10/07/22   Subjective:  Natasha Abbott (DOB: March 04, 1986) is a 37 y.o. female who [facility] for follow up for idiopathic urticaria and chronic rhinitis.   History obtained from: chart review and patient. Last visit was with me on 09/02/2022 and at the time due to her hives being uncontrolled, we discussed oral prednisone course, Xolair every 2 weeks instead of 4, checked SPEP and considered Dermatology referral for biopsy.  Since last visit, she reports doing a lot better.  Maybe has had one episode of hives when she was outside working in the heat and helping her Mom move.  Taking Allegra 360mg  BID and Pepcid 40mg  BID.  Now on Xolair 300mg  every 2 weeks.  Has not required hydroxyzine much.  Rhinitis is doing okay.  Intermittently has drainage and worse at nighttime.  Also has some ear pressure and fullness sometimes. Not using nose sprays.    Past Medical History: Past Medical History:  Diagnosis Date   Gestational diabetes    on insulin with 3rd   Infection    UTI   Kidney stones    Pregnancy induced hypertension    mild elevation with first    Type 2 diabetes mellitus (HCC) 07/17/2017   started on Metformin    Objective:  BP 110/60   Pulse 71   Temp (!) 97.2 F (36.2 C) (Temporal)   Resp 16   SpO2 99%  There is no height or weight on file to calculate BMI. Physical Exam: GEN: alert, well developed HEENT: clear conjunctiva, TM grey and translucent, nose with mild inferior turbinate hypertrophy, pink nasal mucosa, no rhinorrhea, no cobblestoning HEART: regular rate and rhythm, no murmur LUNGS: clear to auscultation bilaterally, no coughing, unlabored respiration SKIN: no rashes or lesions  Assessment:   1. Idiopathic urticaria   2. Chronic rhinitis   3. Dysfunction of both eustachian tubes     Plan/Recommendations:  Idiopathic Urticaria (Hives): - Control improved with Xolair every 2  weeks. - At this time etiology of hives and swelling is unknown. Hives can be caused by a variety of different triggers including illness/infection, exercise, pressure, vibrations, extremes of temperature to name a few however majority of the time there is no identifiable trigger.  - Repeat WBC with PCP has normalized.  - CU index elevated 06/2022.   - Continue Allegra 360mg  twice daily and Pepcid 40mg  twice daily. - Use hydroxyzine 10mg  nightly as needed. Can try a dose in the daytime also if not sleepy.  - Continue Xolair 300mg  every 2 weeks.  Bring Epipen.  - Dermatology referral was placed last visit for biopsy of hives to rule out vasculitic causes as she was not responding to Xolair but has seen improvement with q2week dosing.   Chronic Rhinitis: - Discussed daily INCS to help with her symptoms - also likely has some component of ET tube dysfunction  - Positive skin test 06/2022: none - Use nasal saline rinses before nose sprays such as with Neilmed Sinus Rinse.  Use distilled water.   - Use Flonase 2 sprays each nostril daily. Aim upward and outward. - Use Azelastine 1-2 sprays each nostril twice daily as needed. Aim upward and outward.  Return in about 3 months (around 01/07/2023).  Alesia Morin, MD [facility]

## 2022-10-15 ENCOUNTER — Ambulatory Visit (INDEPENDENT_AMBULATORY_CARE_PROVIDER_SITE_OTHER): Payer: BC Managed Care – PPO | Admitting: *Deleted

## 2022-10-15 DIAGNOSIS — L501 Idiopathic urticaria: Secondary | ICD-10-CM | POA: Diagnosis not present

## 2022-10-29 ENCOUNTER — Ambulatory Visit (INDEPENDENT_AMBULATORY_CARE_PROVIDER_SITE_OTHER): Payer: BC Managed Care – PPO | Admitting: *Deleted

## 2022-10-29 DIAGNOSIS — L501 Idiopathic urticaria: Secondary | ICD-10-CM

## 2022-11-04 ENCOUNTER — Other Ambulatory Visit (HOSPITAL_COMMUNITY): Payer: Self-pay

## 2022-11-05 ENCOUNTER — Other Ambulatory Visit (HOSPITAL_COMMUNITY): Payer: Self-pay

## 2022-11-12 ENCOUNTER — Ambulatory Visit: Payer: BC Managed Care – PPO

## 2022-11-16 ENCOUNTER — Ambulatory Visit (INDEPENDENT_AMBULATORY_CARE_PROVIDER_SITE_OTHER): Payer: BC Managed Care – PPO | Admitting: *Deleted

## 2022-11-16 ENCOUNTER — Encounter: Payer: Self-pay | Admitting: Internal Medicine

## 2022-11-16 DIAGNOSIS — L501 Idiopathic urticaria: Secondary | ICD-10-CM

## 2022-11-17 ENCOUNTER — Encounter: Payer: Self-pay | Admitting: Internal Medicine

## 2022-11-30 ENCOUNTER — Ambulatory Visit (INDEPENDENT_AMBULATORY_CARE_PROVIDER_SITE_OTHER): Payer: BC Managed Care – PPO | Admitting: *Deleted

## 2022-11-30 DIAGNOSIS — L501 Idiopathic urticaria: Secondary | ICD-10-CM

## 2022-12-02 ENCOUNTER — Other Ambulatory Visit (HOSPITAL_COMMUNITY): Payer: Self-pay

## 2022-12-08 ENCOUNTER — Other Ambulatory Visit (HOSPITAL_COMMUNITY): Payer: Self-pay

## 2022-12-14 ENCOUNTER — Ambulatory Visit: Payer: BC Managed Care – PPO

## 2022-12-15 ENCOUNTER — Ambulatory Visit (INDEPENDENT_AMBULATORY_CARE_PROVIDER_SITE_OTHER): Payer: BC Managed Care – PPO | Admitting: *Deleted

## 2022-12-15 ENCOUNTER — Encounter: Payer: Self-pay | Admitting: Allergy

## 2022-12-15 DIAGNOSIS — L501 Idiopathic urticaria: Secondary | ICD-10-CM

## 2022-12-29 ENCOUNTER — Ambulatory Visit: Payer: BC Managed Care – PPO

## 2022-12-29 ENCOUNTER — Other Ambulatory Visit: Payer: Self-pay

## 2022-12-30 ENCOUNTER — Ambulatory Visit (INDEPENDENT_AMBULATORY_CARE_PROVIDER_SITE_OTHER): Payer: BC Managed Care – PPO | Admitting: *Deleted

## 2022-12-30 DIAGNOSIS — L501 Idiopathic urticaria: Secondary | ICD-10-CM

## 2023-01-04 ENCOUNTER — Other Ambulatory Visit: Payer: Self-pay

## 2023-01-06 ENCOUNTER — Other Ambulatory Visit: Payer: Self-pay

## 2023-01-08 ENCOUNTER — Other Ambulatory Visit: Payer: Self-pay

## 2023-01-08 ENCOUNTER — Other Ambulatory Visit (HOSPITAL_COMMUNITY): Payer: Self-pay

## 2023-01-08 ENCOUNTER — Ambulatory Visit: Payer: BC Managed Care – PPO | Admitting: Internal Medicine

## 2023-01-08 DIAGNOSIS — J309 Allergic rhinitis, unspecified: Secondary | ICD-10-CM

## 2023-01-10 ENCOUNTER — Encounter: Payer: Self-pay | Admitting: Internal Medicine

## 2023-01-11 ENCOUNTER — Other Ambulatory Visit (HOSPITAL_COMMUNITY): Payer: Self-pay

## 2023-01-13 ENCOUNTER — Ambulatory Visit: Payer: BC Managed Care – PPO | Admitting: Internal Medicine

## 2023-01-13 ENCOUNTER — Ambulatory Visit: Payer: BC Managed Care – PPO

## 2023-01-13 NOTE — Progress Notes (Deleted)
FOLLOW UP Date of Service/Encounter:  01/13/23   Subjective:  Natasha Abbott (DOB: 1986-01-17) is a 37 y.o. female who [facility] for follow up for idiopathic urticaria and chronic rhinitis.   History obtained from: chart review and patient. Last visit was with me on 10/07/2022 and at the time was doing better with hives with q2 weeks dosing of Xolair. Also on high dose anti histamines. For rhinitis, on Flonase/Azelastine.   Past Medical History: Past Medical History:  Diagnosis Date   Gestational diabetes    on insulin with 3rd   Infection    UTI   Kidney stones    Pregnancy induced hypertension    mild elevation with first    Type 2 diabetes mellitus (HCC) 07/17/2017   started on Metformin    Objective:  There were no vitals taken for this visit. There is no height or weight on file to calculate BMI. Physical Exam: GEN: alert, well developed HEENT: clear conjunctiva, TM grey and translucent, nose with moderate inferior turbinate hypertrophy, pale nasal mucosa, clear rhinorrhea, + cobblestoning HEART: regular rate and rhythm, no murmur LUNGS: clear to auscultation bilaterally, no coughing, unlabored respiration SKIN: no rashes or lesions  Data Reviewed: *** Labs: SPT:  Spirometry:  Tracings reviewed. Her effort: {Blank single:19197::"Good reproducible efforts.","It was hard to get consistent efforts and there is a question as to whether this reflects a maximal maneuver.","Poor effort, data can not be interpreted.","Variable effort-results affected.","decent for first attempt at spirometry."} FVC: ***L FEV1: ***L, ***% predicted FEV1/FVC ratio: ***% Interpretation: {Blank single:19197::"Spirometry consistent with mild obstructive disease","Spirometry consistent with moderate obstructive disease","Spirometry consistent with severe obstructive disease","Spirometry consistent with possible restrictive disease","Spirometry consistent  with mixed obstructive and restrictive disease","Spirometry uninterpretable due to technique","Spirometry consistent with normal pattern","No overt abnormalities noted given today's efforts"}.  Please see scanned spirometry results for details.  Skin Testing:  Skin prick testing was placed, which includes aeroallergens/foods, histamine control, and saline control.  Verbal consent was obtained prior to placing test.  Patient tolerated procedure well.  Allergy testing results were read and interpreted by myself, documented by clinical staff. Adequate positive and negative control.  Positive results to:  Results discussed with patient/family.    Assessment:   No diagnosis found.  Plan/Recommendations:  Idiopathic Urticaria (Hives): - Now controlled on Xolair q2weeks.   - At this time etiology of hives and swelling is unknown. Hives can be caused by a variety of different triggers including illness/infection, exercise, pressure, vibrations, extremes of temperature to name a few however majority of the time there is no identifiable trigger.  - CU index elevated 06/2022.   - Continue Allegra 360mg  twice daily and Pepcid 40mg  twice daily. - Use hydroxyzine 10mg  nightly as needed. Can try a dose in the daytime also if not sleepy.  - Decrease to Xolair 300mg  every 3 weeks.  Bring Epipen.  If hives worsen, go back to every 2 weeks.  If the hives remain well controlled for 3 months, can space out further to every 4 weeks.   Chronic Rhinitis: - Positive skin test 06/2022: none - Use nasal saline rinses before nose sprays such as with Neilmed Sinus Rinse.  Use distilled water.   - Use Flonase 2 sprays each nostril daily. Aim upward and outward. - Use Azelastine 1-2 sprays each nostril twice daily as needed. Aim upward and outward.  No follow-ups on file.  Natasha Morin, MD [facility]

## 2023-01-28 ENCOUNTER — Other Ambulatory Visit (HOSPITAL_COMMUNITY): Payer: Self-pay

## 2023-02-11 ENCOUNTER — Other Ambulatory Visit: Payer: Self-pay

## 2023-02-15 ENCOUNTER — Ambulatory Visit: Payer: BC Managed Care – PPO

## 2023-02-16 ENCOUNTER — Other Ambulatory Visit: Payer: Self-pay

## 2023-03-04 ENCOUNTER — Other Ambulatory Visit: Payer: Self-pay

## 2023-03-18 ENCOUNTER — Other Ambulatory Visit: Payer: Self-pay

## 2023-04-07 ENCOUNTER — Other Ambulatory Visit: Payer: Self-pay

## 2023-04-29 ENCOUNTER — Other Ambulatory Visit: Payer: Self-pay

## 2023-05-03 ENCOUNTER — Other Ambulatory Visit: Payer: Self-pay

## 2023-05-03 NOTE — Progress Notes (Signed)
Patient Disenrolled from program.

## 2023-08-06 ENCOUNTER — Telehealth: Payer: Self-pay | Admitting: Internal Medicine

## 2023-08-06 NOTE — Telephone Encounter (Signed)
 Left voicemail to give the office a call back to schedule Xolair reapproval appointment.
